# Patient Record
Sex: Male | Born: 1950 | Race: White | Hispanic: No | Marital: Married | Smoking: Former smoker
Health system: Southern US, Community
[De-identification: ages and names within clinical notes are randomized; demographics above are authoritative.]

## PROBLEM LIST (undated history)

## (undated) DIAGNOSIS — E785 Hyperlipidemia, unspecified: Secondary | ICD-10-CM

## (undated) DIAGNOSIS — L570 Actinic keratosis: Secondary | ICD-10-CM

## (undated) DIAGNOSIS — I10 Essential (primary) hypertension: Secondary | ICD-10-CM

## (undated) DIAGNOSIS — Z720 Tobacco use: Secondary | ICD-10-CM

## (undated) HISTORY — DX: Tobacco use: Z72.0

## (undated) HISTORY — DX: Hyperlipidemia, unspecified: E78.5

## (undated) HISTORY — DX: Essential (primary) hypertension: I10

## (undated) HISTORY — DX: Actinic keratosis: L57.0

---

## 1994-04-08 HISTORY — PX: OTHER SURGICAL HISTORY: SHX169

## 2003-08-09 DIAGNOSIS — C4491 Basal cell carcinoma of skin, unspecified: Secondary | ICD-10-CM

## 2003-08-09 HISTORY — DX: Basal cell carcinoma of skin, unspecified: C44.91

## 2003-12-07 ENCOUNTER — Other Ambulatory Visit: Payer: Self-pay

## 2005-07-05 ENCOUNTER — Ambulatory Visit: Payer: Self-pay | Admitting: Physician Assistant

## 2005-11-08 ENCOUNTER — Ambulatory Visit: Payer: Self-pay | Admitting: Gastroenterology

## 2006-08-08 ENCOUNTER — Emergency Department: Payer: Self-pay

## 2015-01-27 ENCOUNTER — Other Ambulatory Visit: Payer: Self-pay | Admitting: Family Medicine

## 2015-01-27 ENCOUNTER — Encounter: Payer: Self-pay | Admitting: Family Medicine

## 2015-01-27 DIAGNOSIS — Z720 Tobacco use: Secondary | ICD-10-CM

## 2015-01-27 HISTORY — DX: Tobacco use: Z72.0

## 2015-01-28 ENCOUNTER — Inpatient Hospital Stay: Payer: BLUE CROSS/BLUE SHIELD | Attending: Family Medicine | Admitting: Family Medicine

## 2015-01-28 ENCOUNTER — Other Ambulatory Visit: Payer: Self-pay | Admitting: Family Medicine

## 2015-01-28 ENCOUNTER — Ambulatory Visit
Admission: RE | Admit: 2015-01-28 | Discharge: 2015-01-28 | Disposition: A | Discharge status: Home / Self Care | Payer: Medicare Other | Source: Ambulatory Visit | Attending: Family Medicine | Admitting: Family Medicine

## 2015-01-28 DIAGNOSIS — J439 Emphysema, unspecified: Secondary | ICD-10-CM | POA: Insufficient documentation

## 2015-01-28 DIAGNOSIS — I700 Atherosclerosis of aorta: Secondary | ICD-10-CM | POA: Diagnosis not present

## 2015-01-28 DIAGNOSIS — Z720 Tobacco use: Secondary | ICD-10-CM | POA: Insufficient documentation

## 2015-01-28 DIAGNOSIS — Z87891 Personal history of nicotine dependence: Secondary | ICD-10-CM

## 2015-01-28 DIAGNOSIS — Z122 Encounter for screening for malignant neoplasm of respiratory organs: Secondary | ICD-10-CM | POA: Diagnosis not present

## 2015-01-28 NOTE — Progress Notes (Signed)
In accordance with CMS guidelines, patient has meet eligibility criteria including age, absence of signs or symptoms of lung cancer, the specific calculation of cigarette smoking pack-years was 30 years and he is a former smoker who quit approximately 11 years ago.  A shared decision-making session was conducted prior to the performance of CT scan. This includes one or more decision aids, includes benefits and harms of screening, follow-up diagnostic testing, over-diagnosis, false positive rate, and total radiation exposure.  Counseling on the importance of adherence to annual lung cancer LDCT screening, impact of co-morbidities, and ability or willingness to undergo diagnosis and treatment is imperative for compliance of the program.  Counseling on the importance of continued smoking cessation for former smokers; the importance of smoking cessation for current smokers and information about tobacco cessation interventions have been given to patient including the [place] [facility], 1800 quit [place], as well as [place] specific smoking cessation programs.  Written order for lung cancer screening with LDCT has been given to the patient and any and all questions have been answered to the best of my abilities.   Yearly follow up will be scheduled by Burgess Estelle, Thoracic Navigator.

## 2015-01-29 ENCOUNTER — Telehealth: Payer: Self-pay | Admitting: *Deleted

## 2015-01-29 NOTE — Telephone Encounter (Signed)
Oncology Nurse Navigator Documentation  Oncology Nurse Navigator Flowsheets 01/29/2015  Navigator Encounter Type Screening  Time Spent with Patient 15   Notified patient of LDCT lung cancer screening results of Lung Rads  2   finding with recommendation for 12 month follow up imaging. Also notified of incidental finding of mild emphysema and aortic atherosclerosis.

## 2016-02-10 ENCOUNTER — Telehealth: Payer: Self-pay | Admitting: *Deleted

## 2016-02-10 NOTE — Telephone Encounter (Signed)
Notified patient that annual lung cancer screening low dose CT scan is due. Confirmed that patient is within the age range of 55-77, and asymptomatic, (no signs or symptoms of lung cancer). Patient denies illness that would prevent curative treatment for lung cancer if found. The patient is a former, smoker, quit 2005, with a 30 pack year history. The shared decision making visit was done 01/28/15 Patient is agreeable for CT scan being scheduled.

## 2016-02-15 ENCOUNTER — Other Ambulatory Visit: Payer: Self-pay | Admitting: Family Medicine

## 2016-02-15 DIAGNOSIS — Z87891 Personal history of nicotine dependence: Secondary | ICD-10-CM | POA: Insufficient documentation

## 2016-03-23 ENCOUNTER — Ambulatory Visit
Admission: RE | Admit: 2016-03-23 | Discharge: 2016-03-23 | Disposition: A | Discharge status: Home / Self Care | Payer: Medicare Other | Source: Ambulatory Visit | Attending: Family Medicine | Admitting: Family Medicine

## 2016-03-23 DIAGNOSIS — Z122 Encounter for screening for malignant neoplasm of respiratory organs: Secondary | ICD-10-CM | POA: Insufficient documentation

## 2016-03-23 DIAGNOSIS — I700 Atherosclerosis of aorta: Secondary | ICD-10-CM | POA: Insufficient documentation

## 2016-03-23 DIAGNOSIS — Z87891 Personal history of nicotine dependence: Secondary | ICD-10-CM

## 2016-03-23 DIAGNOSIS — I2510 Atherosclerotic heart disease of native coronary artery without angina pectoris: Secondary | ICD-10-CM | POA: Diagnosis not present

## 2016-03-25 ENCOUNTER — Telehealth: Payer: Self-pay | Admitting: *Deleted

## 2016-03-25 NOTE — Telephone Encounter (Signed)
Notified patient of LDCT lung cancer screening results with recommendation for 12 month follow up imaging. Also notified of incidental finding noted below. Patient verbalizes understanding.   IMPRESSION: 1. Lung-RADS Category 2S, benign appearance or behavior. Continue annual screening with low-dose chest CT without contrast in 12 months. 2. The "S" modifier above refers to potentially clinically significant non lung cancer related findings. Specifically, three-vessel coronary atherosclerosis . 3. Aortic atherosclerosis

## 2017-03-13 ENCOUNTER — Telehealth: Payer: Self-pay | Admitting: *Deleted

## 2017-03-13 DIAGNOSIS — Z87891 Personal history of nicotine dependence: Secondary | ICD-10-CM

## 2017-03-13 DIAGNOSIS — Z122 Encounter for screening for malignant neoplasm of respiratory organs: Secondary | ICD-10-CM

## 2017-03-13 NOTE — Telephone Encounter (Signed)
Notified patient that annual lung cancer screening low dose CT scan is due currently or will be in near future. Confirmed that patient is within the age range of 55-77, and asymptomatic, (no signs or symptoms of lung cancer). Patient denies illness that would prevent curative treatment for lung cancer if found. Verified smoking history, (former, quit 2005, 30 pack year). The shared decision making visit was done 01/28/15. Patient is agreeable for CT scan being scheduled.

## 2017-03-21 ENCOUNTER — Ambulatory Visit: Payer: Medicare Other

## 2017-03-27 ENCOUNTER — Ambulatory Visit
Admission: RE | Admit: 2017-03-27 | Discharge: 2017-03-27 | Disposition: A | Discharge status: Home / Self Care | Payer: Medicare Other | Source: Ambulatory Visit | Attending: Oncology | Admitting: Oncology

## 2017-03-27 DIAGNOSIS — J432 Centrilobular emphysema: Secondary | ICD-10-CM | POA: Diagnosis not present

## 2017-03-27 DIAGNOSIS — Z122 Encounter for screening for malignant neoplasm of respiratory organs: Secondary | ICD-10-CM | POA: Diagnosis present

## 2017-03-27 DIAGNOSIS — I700 Atherosclerosis of aorta: Secondary | ICD-10-CM | POA: Diagnosis not present

## 2017-03-27 DIAGNOSIS — Z87891 Personal history of nicotine dependence: Secondary | ICD-10-CM | POA: Diagnosis not present

## 2017-04-03 ENCOUNTER — Encounter: Payer: Self-pay | Admitting: *Deleted

## 2018-03-12 ENCOUNTER — Telehealth: Payer: Self-pay | Admitting: *Deleted

## 2018-03-12 NOTE — Telephone Encounter (Signed)
Patient called r/t CT screening of lung due at this time.  Patient voiced understanding and agreement with CT screening.  Appointment sent to scheduling for future appointment.    

## 2018-03-13 ENCOUNTER — Telehealth: Payer: Self-pay | Admitting: *Deleted

## 2018-03-13 DIAGNOSIS — Z122 Encounter for screening for malignant neoplasm of respiratory organs: Secondary | ICD-10-CM

## 2018-03-13 DIAGNOSIS — Z87891 Personal history of nicotine dependence: Secondary | ICD-10-CM

## 2018-03-13 NOTE — Telephone Encounter (Signed)
Patient has been notified that annual lung cancer screening low dose CT scan is due currently or will be in near future. Confirmed that patient is within the age range of 55-77, and asymptomatic, (no signs or symptoms of lung cancer). Patient denies illness that would prevent curative treatment for lung cancer if found. Verified smoking history, (former, quit 2005, 30 pack year). The shared decision making visit was done 01/28/15. Patient is agreeable for CT scan being scheduled.

## 2018-03-27 ENCOUNTER — Ambulatory Visit
Admission: RE | Admit: 2018-03-27 | Discharge: 2018-03-27 | Disposition: A | Discharge status: Home / Self Care | Payer: Medicare Other | Source: Ambulatory Visit | Attending: Nurse Practitioner | Admitting: Nurse Practitioner

## 2018-03-27 ENCOUNTER — Encounter: Payer: Self-pay | Admitting: *Deleted

## 2018-03-27 DIAGNOSIS — R918 Other nonspecific abnormal finding of lung field: Secondary | ICD-10-CM | POA: Insufficient documentation

## 2018-03-27 DIAGNOSIS — Z122 Encounter for screening for malignant neoplasm of respiratory organs: Secondary | ICD-10-CM | POA: Insufficient documentation

## 2018-03-27 DIAGNOSIS — I700 Atherosclerosis of aorta: Secondary | ICD-10-CM | POA: Insufficient documentation

## 2018-03-27 DIAGNOSIS — I2510 Atherosclerotic heart disease of native coronary artery without angina pectoris: Secondary | ICD-10-CM | POA: Insufficient documentation

## 2018-03-27 DIAGNOSIS — J439 Emphysema, unspecified: Secondary | ICD-10-CM | POA: Diagnosis not present

## 2018-03-27 DIAGNOSIS — Z87891 Personal history of nicotine dependence: Secondary | ICD-10-CM | POA: Diagnosis not present

## 2019-10-22 ENCOUNTER — Other Ambulatory Visit: Payer: Self-pay

## 2019-10-22 ENCOUNTER — Encounter: Payer: Self-pay | Admitting: Dermatology

## 2019-10-22 ENCOUNTER — Ambulatory Visit (INDEPENDENT_AMBULATORY_CARE_PROVIDER_SITE_OTHER): Payer: Medicare Other | Admitting: Dermatology

## 2019-10-22 DIAGNOSIS — L570 Actinic keratosis: Secondary | ICD-10-CM

## 2019-10-22 DIAGNOSIS — Z1283 Encounter for screening for malignant neoplasm of skin: Secondary | ICD-10-CM | POA: Diagnosis not present

## 2019-10-22 DIAGNOSIS — L578 Other skin changes due to chronic exposure to nonionizing radiation: Secondary | ICD-10-CM

## 2019-10-22 DIAGNOSIS — L814 Other melanin hyperpigmentation: Secondary | ICD-10-CM

## 2019-10-22 DIAGNOSIS — L820 Inflamed seborrheic keratosis: Secondary | ICD-10-CM | POA: Diagnosis not present

## 2019-10-22 DIAGNOSIS — Z85828 Personal history of other malignant neoplasm of skin: Secondary | ICD-10-CM

## 2019-10-22 DIAGNOSIS — D229 Melanocytic nevi, unspecified: Secondary | ICD-10-CM

## 2019-10-22 DIAGNOSIS — L821 Other seborrheic keratosis: Secondary | ICD-10-CM

## 2019-10-22 DIAGNOSIS — D1801 Hemangioma of skin and subcutaneous tissue: Secondary | ICD-10-CM

## 2019-10-22 NOTE — Patient Instructions (Signed)
Cryotherapy Aftercare  . Wash gently with soap and water everyday.   . Apply Vaseline and Band-Aid daily until healed.  

## 2019-10-22 NOTE — Progress Notes (Signed)
   Follow-Up Visit   Subjective  Jeremy Gilbert is a 69 y.o. male who presents for the following: Annual Exam (UBSE. Sore on left shoulder to be checked. Hx BCC of the left alar crease.).  The following portions of the chart were reviewed this encounter and updated as appropriate:     Review of Systems: No other skin or systemic complaints.  Objective  Well appearing patient in no apparent distress; mood and affect are within normal limits.  All skin waist up examined.  Objective  Right Dorsal Hand x 1, right forearm x 1, nasal dorsum x 1 (3): Erythematous thin papules/macules with gritty scale.   Objective  Face, arms: Diffuse scaly erythematous macules with underlying dyspigmentation.   Objective  Trunk: Red papules.   Objective  Left Alar Crease: Well healed scar with no evidence of recurrence.   Objective  Left medial shoulder x 1, chest x 4, left flank x 1 (6): Erythematous keratotic or waxy stuck-on papule or plaque. Irritated  Objective  Arms: Scattered tan macules.   Objective  Left Nasal Rim: 4.72mm flesh yellow papule Nevus vs Sebaceous Hyperplasia   Objective  Trunk: Stuck-on, waxy, tan-brown papules and plaques -- Discussed benign etiology and prognosis.   Assessment & Plan  AK (actinic keratosis) (3) Right Dorsal Hand x 1, right forearm x 1, nasal dorsum x 1  Destruction of lesion - Right Dorsal Hand x 1, right forearm x 1, nasal dorsum x 1  Destruction method: cryotherapy   Informed consent: discussed and consent obtained   Lesion destroyed using liquid nitrogen: Yes   Outcome: patient tolerated procedure well with no complications   Post-procedure details: wound care instructions given    Actinic skin damage Face, arms  Recommend daily use of broad spectrum spf 30+ sunscreen   Hemangioma of skin Trunk  Benign, observe.    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 No evidence of recurrence.   Inflamed  seborrheic keratosis (6) Left medial shoulder x 1, chest x 4, left flank x 1  Destruction of lesion - Left medial shoulder x 1, chest x 4, left flank x 1  Destruction method: cryotherapy   Informed consent: discussed and consent obtained   Lesion destroyed using liquid nitrogen: Yes   Outcome: patient tolerated procedure well with no complications   Post-procedure details: wound care instructions given    Lentigines Arms  Benign, observe.    Nevus Left Nasal Rim  Benign-appearing.  Observation   Seborrheic keratosis Trunk  Benign, observe.

## 2019-11-01 IMAGING — CT CT CHEST LUNG CANCER SCREENING LOW DOSE W/O CM
2 of 5 series · 15 of 40 positions shown, 18 images · non-contrast
Comparison: 03/27/2017

CLINICAL DATA: Lung cancer screening. Thirty pack-year history.
Current asymptomatic former smoker.

EXAM:
CT CHEST WITHOUT CONTRAST LOW-DOSE FOR LUNG CANCER SCREENING
TECHNIQUE: Multidetector CT imaging of the chest was performed following the
standard protocol without IV contrast.

[Series 3: lung · axial · 0.65mm/px · z∈[-1150,-856]mm · 12 of 324 slices shown, 15 images (1 of 2)]
[im 15/324  mediastinal]
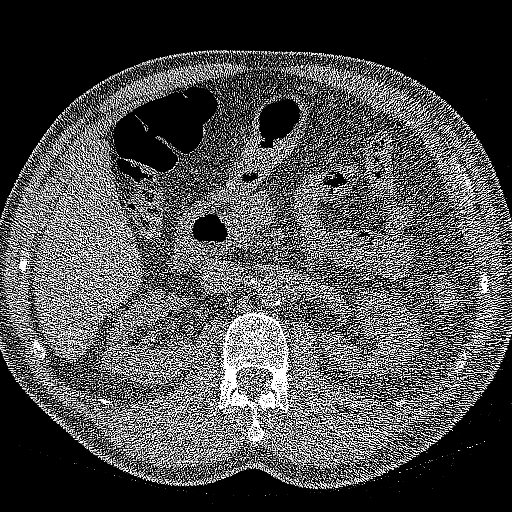
[im 15/324  lung]
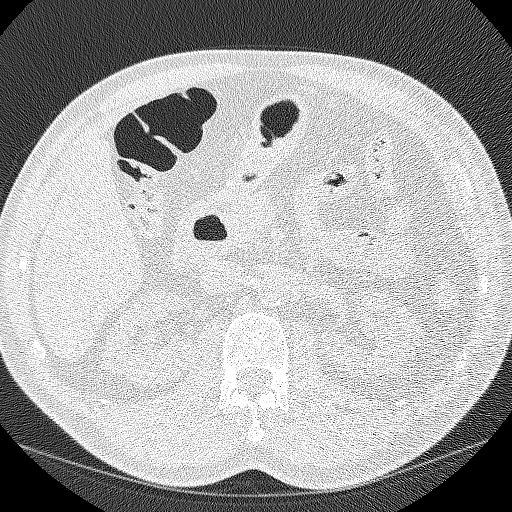
[im 45/324  lung]
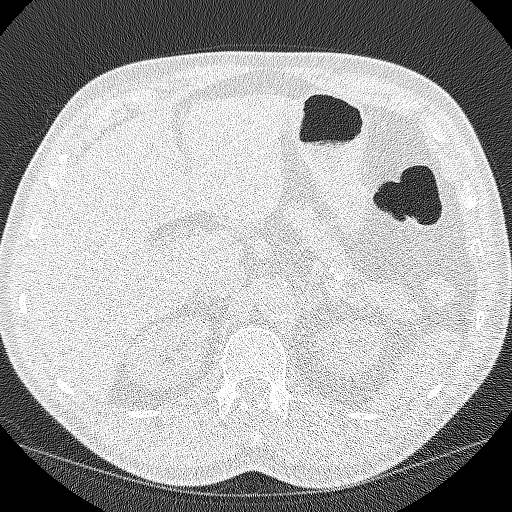
[im 74/324  lung]
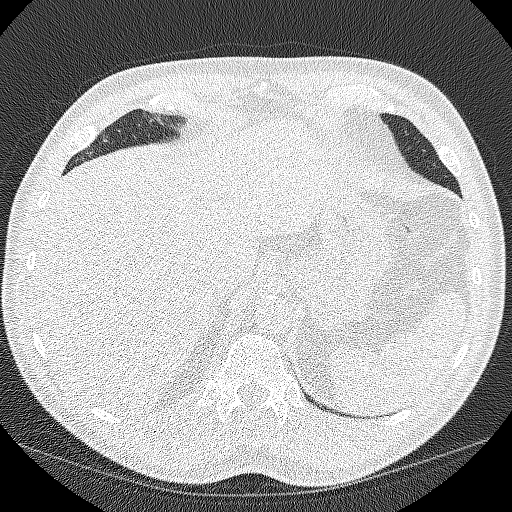
[im 103/324  lung]
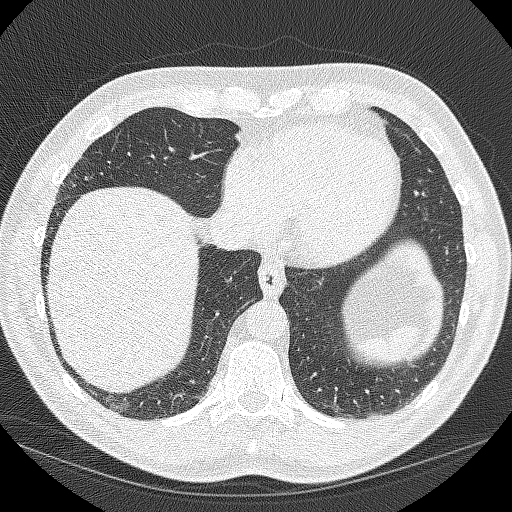
[im 118/324  mediastinal]
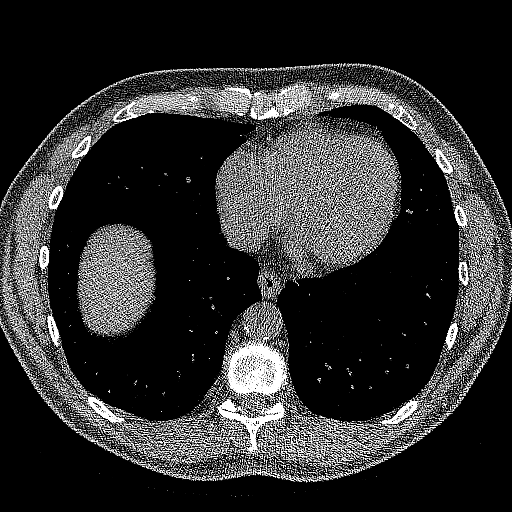
[im 118/324  lung]
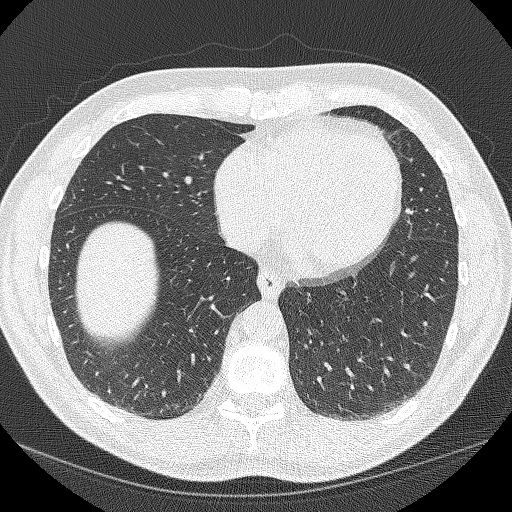
[im 147/324  lung]
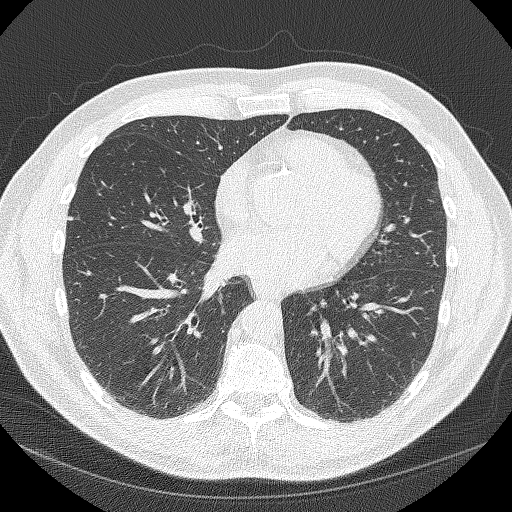
[im 177/324  lung]
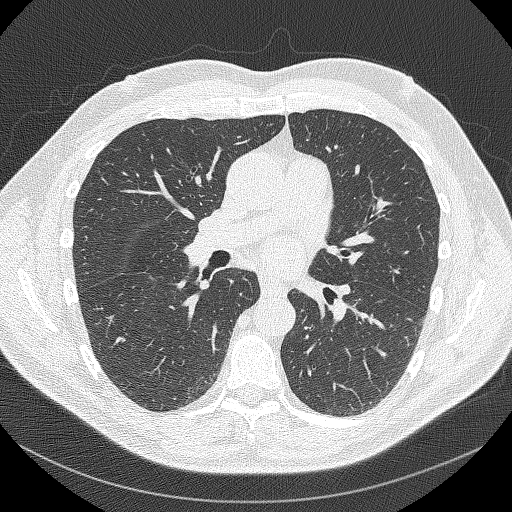
[im 206/324  lung]
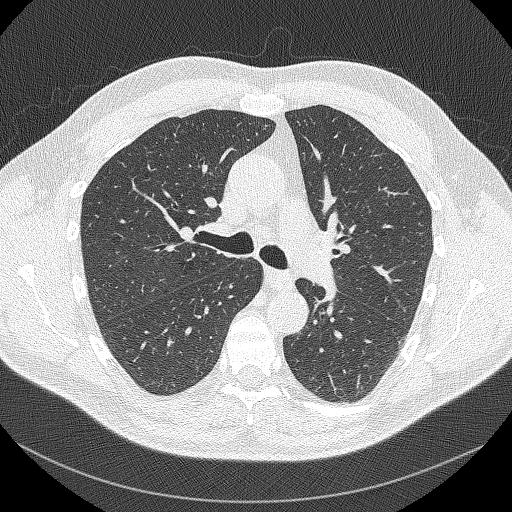
[im 221/324  mediastinal]
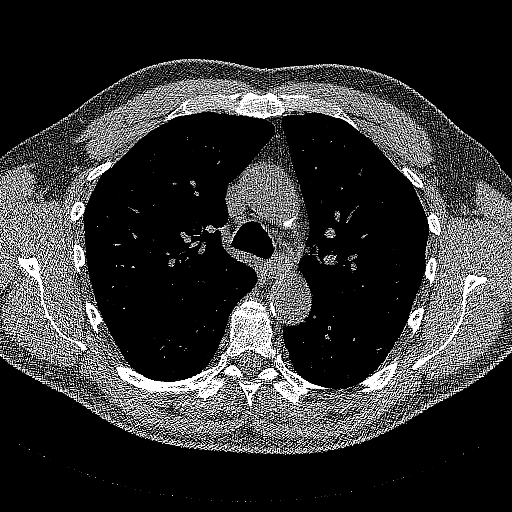
[im 221/324  lung]
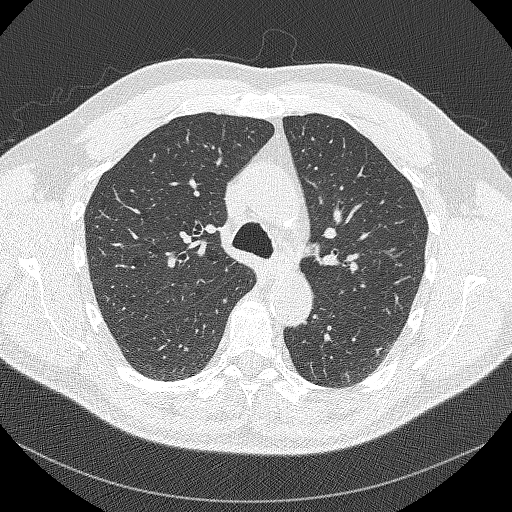
[im 250/324  lung]
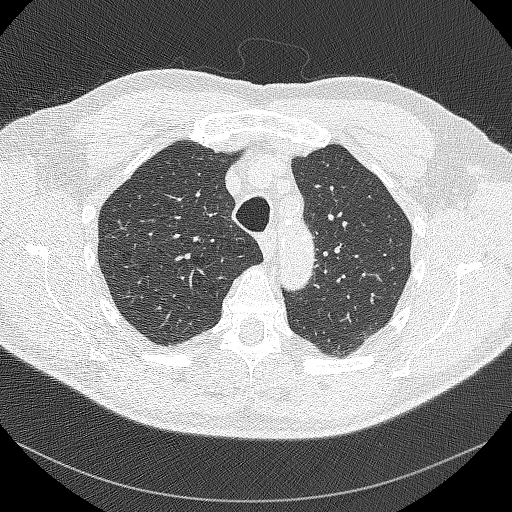
[im 279/324  lung]
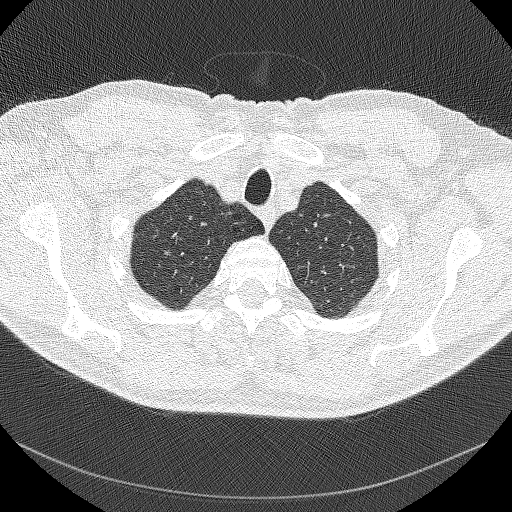
[im 309/324  lung]
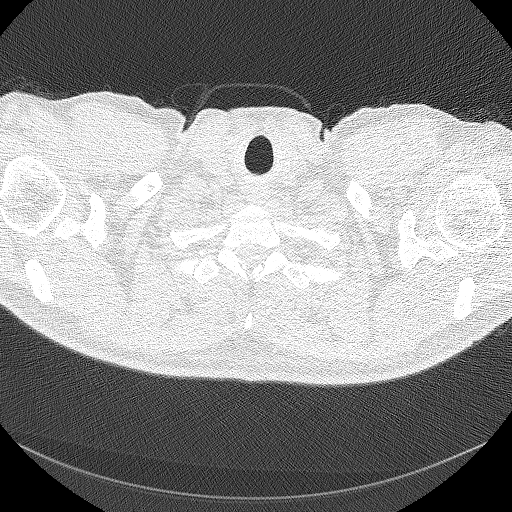

[Series 4: lung · coronal · 0.63mm/px · 3 of 278 slices shown (2 of 2)]
[im 56/278  lung]
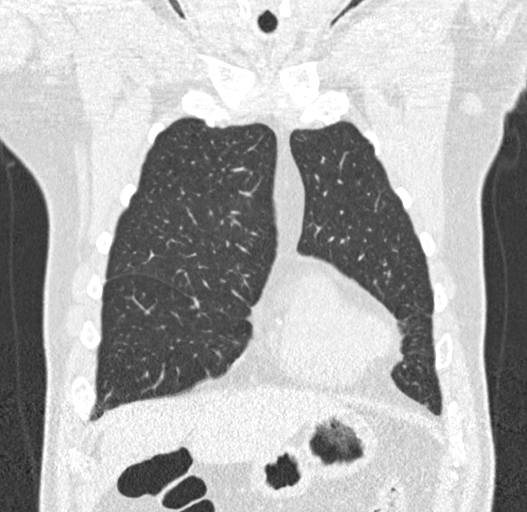
[im 111/278  lung]
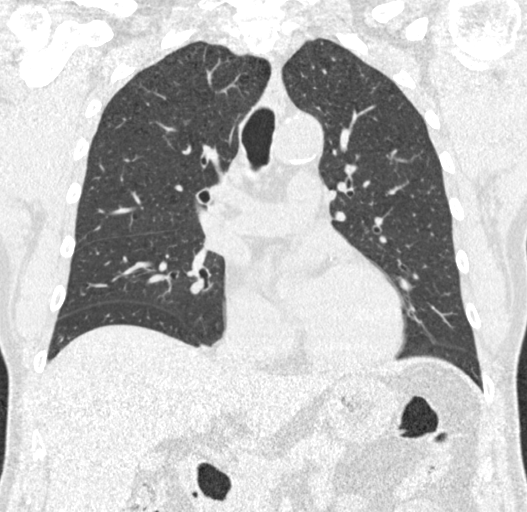
[im 167/278  lung]
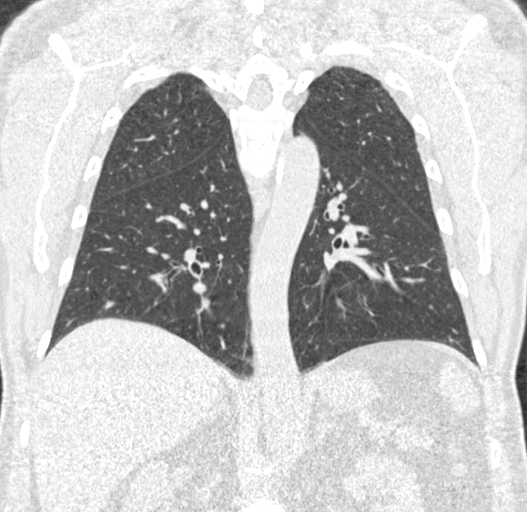

[15 of 40 positions shown; findings below may reference images not displayed]

FINDINGS: Cardiovascular: The heart size appears within normal limits. There
is no pericardial effusion identified. Aortic atherosclerosis.
Calcification in the LAD and left circumflex and RCA coronary
arteries noted.

Mediastinum/Nodes: Normal appearance of the thyroid gland. The
trachea appears patent and is midline. Normal appearance of the
esophagus. No enlarged axillary, mediastinal or hilar adenopathy.

Lungs/Pleura: Moderate changes of emphysema. No pleural effusion
identified. No airspace consolidation, pneumothorax or atelectasis.
Previously noted small solid pulmonary nodules are again noted and
do not appear significantly changed in the interval. The largest is
in the medial right upper lobe with an equivalent diameter 4.1 mm.
No new suspicious pulmonary nodules.

Upper Abdomen: No acute abnormality.

Musculoskeletal: No chest wall mass or suspicious bone lesions
identified.
IMPRESSION: 1. Lung-RADS 2, benign appearance or behavior. Continue annual
screening with low-dose chest CT without contrast in 12 months.
2. Aortic Atherosclerosis (WJBVY-JTV.V) and Emphysema (WJBVY-UWZ.3).
3. Multi vessel coronary artery atherosclerotic calcifications.

## 2020-04-09 ENCOUNTER — Other Ambulatory Visit: Payer: Self-pay | Admitting: Oncology

## 2020-04-09 DIAGNOSIS — R918 Other nonspecific abnormal finding of lung field: Secondary | ICD-10-CM

## 2020-04-09 NOTE — Progress Notes (Signed)
  Pulmonary Nodule Clinic Telephone Note [place]   Received referral from [facility] [place], low-dose CT screening program.  HPI: Jeremy Gilbert is a 69 year old male with past medical history significant for tobacco abuse who was previously part of the low-dose CT screening program.  Patient quit smoking in 2005 so no longer meets criteria.  Last CT chest from 03/27/2018 shows several small solid pulmonary nodules that remained unchanged but one that measured 4.1 mm in size.  No suspicious pulmonary nodules.  Recommended follow-up in 12 months.  Review and Recommendations: I personally reviewed all patient's previous imaging including most recent low-dose CT scan from August 2019.  I recommend follow-up with noncontrast CT of his chest in the next 1 to 2 weeks.  Social History: Patient is a former smoker.    Tobacco Use:   . Smoking Tobacco Use: Not on file  . Smokeless Tobacco Use: Not on file     High risk factors include: History of heavy smoking, exposure to asbestos, radium or uranium, personal family history of lung cancer, older age, sex (females greater than males), race (black and native [place] greater than weight), marginal speculation, upper lobe location, multiplicity (less than 5 nodules increases risk for malignancy) and emphysema and/or pulmonary fibrosis.   This recommendation follows the consensus statement: Guidelines for Management of Incidental Pulmonary Nodules Detected on CT Images: From the Fleischner Society 2017; Radiology 2017; 284:228-243.    I have placed order for CT scan without contrast to be completed in approximately 1 to 2 weeks..  Disposition: Order placed for repeat CT chest. Will notify [place] in scheduling. [place] to call patient with appointment date and time. Return to pulmonary nodule clinic a few days after his repeat imaging to discuss results and plan moving forward.  Faythe Casa, NP 04/09/2020 10:10  AM

## 2020-04-16 ENCOUNTER — Telehealth: Payer: Self-pay | Admitting: *Deleted

## 2020-04-16 NOTE — Telephone Encounter (Signed)
Left message with patient to review referral to the lung nodule program and review upcoming appts. Pt instructed to call back to further discuss.

## 2020-04-17 ENCOUNTER — Ambulatory Visit: Payer: Medicare Other

## 2020-04-20 ENCOUNTER — Ambulatory Visit: Payer: Medicare Other

## 2020-04-20 NOTE — Telephone Encounter (Signed)
Spoke with patient and reviewed upcoming appts scheduled in the lung nodule clinic. appts mailed per pt request. Instructed to call back with any further questions or needs. Pt verbalized understanding.

## 2020-04-23 ENCOUNTER — Ambulatory Visit: Payer: Medicare Other | Admitting: Oncology

## 2020-04-27 ENCOUNTER — Other Ambulatory Visit: Payer: Self-pay

## 2020-04-27 ENCOUNTER — Ambulatory Visit
Admission: RE | Admit: 2020-04-27 | Discharge: 2020-04-27 | Disposition: A | Discharge status: Home / Self Care | Payer: Medicare Other | Source: Ambulatory Visit | Attending: Oncology | Admitting: Oncology

## 2020-04-27 DIAGNOSIS — R918 Other nonspecific abnormal finding of lung field: Secondary | ICD-10-CM | POA: Diagnosis not present

## 2020-04-30 ENCOUNTER — Inpatient Hospital Stay: Payer: Medicare Other | Attending: Oncology | Admitting: Oncology

## 2020-04-30 DIAGNOSIS — R918 Other nonspecific abnormal finding of lung field: Secondary | ICD-10-CM

## 2020-04-30 NOTE — Progress Notes (Signed)
Pulmonary Nodule Clinic Consult note [facility]  [phone] [phone]  Patient Care Team: Juluis Pitch, MD as PCP - General (Family Medicine)   Name of the patient: Jeremy Gilbert  [id number]  1950/08/09   Date of visit: 04/30/2020   Diagnosis- Lung Nodule  Virtual Visit via Telephone Note   I connected with Jeremy Gilbert on 04/30/20 at 10:00am by telephone visit and verified that I am speaking with the correct person using two identifiers.   I discussed the limitations, risks, security and privacy concerns of performing an evaluation and management service by telemedicine and the availability of in-person appointments. I also discussed with the patient that there may be a patient responsible charge related to this service. The patient expressed understanding and agreed to proceed.   Other persons participating in the visit and their role in the encounter:   Patient's location: Home  Provider's location: Clinic  Chief complaint/ Reason for visit- Pulmonary Nodule Clinic Initial Visit  Past Medical History:  Jeremy Gilbert is a 69 year old male with past medical history significant for tobacco abuse who was previously part of the low-dose CT screening program.  Patient quit smoking in 2005 so no longer meets criteria.  Last CT chest from 03/27/2018 shows several small solid pulmonary nodules that remained unchanged but one that measured 4.1 mm in size.  No suspicious pulmonary nodules.  Recommended follow-up in 12 months.  Interval history-  Jeremy Gilbert presents today to review his most recent CT chest without contrast.  Jeremy Gilbert is retired. 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 He has been retired for about 5 years now. 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  He is married and lives at home with his wife in [place].  He is vaccinated against COVID-19. Patient did have Covid back in December 2020 about a week after his first vaccine. He was not hospitalized and recovered well.  He quit smoking back in 2005. He smoked less than a pack of cigarettes per day for about 25 years.    Tobacco Use:   . Smoking Tobacco Use: Not on file  . Smokeless Tobacco Use: Not on file    Has family history of cancer in both of his grandfathers but he is uncertain of the type. Has personal history of actinic keratosis (10/22/2019) from right dorsal hand, right forearm and nasal dorsum that was removed via cryotherapy by Dr. Nicole Kindred in dermatology.  Currently, he has a "head cold". States yesterday he developed sinus congestion with slight nonproductive cough and fever. He denies any unintentional weight loss, chest pain, nausea, vomiting, constipation or diarrhea.  ECOG FS:0 - Asymptomatic  Review of systems- Review of Systems  Constitutional: Positive for fever and malaise/fatigue. Negative for chills and weight loss.  HENT: Positive for congestion and sinus pain. Negative for ear pain and tinnitus.   Eyes: Negative.  Negative for blurred vision and double vision.  Respiratory: Negative.  Negative for cough, sputum production and shortness of breath.   Cardiovascular: Negative.  Negative for chest pain, palpitations and leg swelling.  Gastrointestinal: Negative.  Negative for abdominal pain, constipation, diarrhea, nausea and vomiting.  Genitourinary: Negative for dysuria, frequency and urgency.  Musculoskeletal: Positive for myalgias. Negative for back pain and falls.  Skin: Negative.  Negative for rash.  Neurological: Negative.  Negative for weakness and headaches.  Endo/Heme/Allergies: Negative.  Does  not bruise/bleed easily.  Psychiatric/Behavioral: Negative.  Negative for depression. The patient is not nervous/anxious and does not have insomnia.      Allergies  Allergen Reactions  . Ace Inhibitors  Cough  . Hydrocodone-Acetaminophen Itching  . Oxycodone-Acetaminophen Other (See Comments)    shaking     Past Medical History:  Diagnosis Date  . Actinic keratosis   . Basal cell carcinoma 2005   L alar crease  . Tobacco abuse 01/27/2015     No past surgical history on file.  Social History   Socioeconomic History  . Marital status: Married    Spouse name: Not on file  . Number of children: Not on file  . Years of education: Not on file  . Highest education level: Not on file  Occupational History  . Not on file  Tobacco Use  . Smoking status: Not on file  Substance and Sexual Activity  . Alcohol use: Not on file  . Drug use: Not on file  . Sexual activity: Not on file  Other Topics Concern  . Not on file  Social History Narrative  . Not on file   Social Determinants of Health   Financial Resource Strain:   . Difficulty of Paying Living Expenses: Not on file  Food Insecurity:   . Worried About Charity fundraiser in the Last Year: Not on file  . Ran Out of Food in the Last Year: Not on file  Transportation Needs:   . Lack of Transportation (Medical): Not on file  . Lack of Transportation (Non-Medical): Not on file  Physical Activity:   . Days of Exercise per Week: Not on file  . Minutes of Exercise per Session: Not on file  Stress:   . Feeling of Stress : Not on file  Social Connections:   . Frequency of Communication with Friends and Family: Not on file  . Frequency of Social Gatherings with Friends and Family: Not on file  . Attends Religious Services: Not on file  . Active Member of Clubs or Organizations: Not on file  . Attends Archivist Meetings: Not on file  . Marital Status: Not on file  Intimate Partner Violence:   . Fear of Current or Ex-Partner: Not on file  . Emotionally Abused: Not on file  . Physically Abused: Not on file  . Sexually Abused: Not on file    No family history on file.   Current Outpatient Medications:  .   losartan (COZAAR) 100 MG tablet, Take 100 mg by mouth once., Disp: , Rfl:  .  metoprolol succinate (TOPROL-XL) 100 MG 24 hr tablet, Take 200 mg by mouth once., Disp: , Rfl:   Physical exam: There were no vitals filed for this visit. Limited secondary to telephone visit.  No flowsheet data found. No flowsheet data found.  No images are attached to the encounter.  CT Chest Wo Contrast  Result Date: 04/27/2020 CLINICAL DATA:  Followup pulmonary nodules. EXAM: CT CHEST WITHOUT CONTRAST TECHNIQUE: Multidetector CT imaging of the chest was performed following the standard protocol without IV contrast. COMPARISON:  Multiple prior lung cancer screening CT scans. The most recent is 03/27/2018 FINDINGS: Cardiovascular: The heart is normal in size. No pericardial effusion. Stable mild tortuosity and moderate atherosclerotic calcification involving the thoracic aorta. No focal aneurysm. Mediastinum/Nodes: Small scattered mediastinal and hilar lymph nodes are stable. No mass or overt adenopathy. The esophagus is unremarkable. Lungs/Pleura: Stable underlying emphysematous changes. No acute overlying pulmonary process. A few  tiny scattered subpleural nodules are stable. The 4 mm nodule seen in the right upper lobe medially on the prior CT scan now measures 2.5 mm. No new pulmonary nodules or worrisome pulmonary lesions. Upper Abdomen: No significant upper abdominal findings. Stable scattered atherosclerotic calcifications. No worrisome hepatic or adrenal gland lesions. Musculoskeletal: No chest wall mass, supraclavicular or axillary adenopathy. Slight increase and symmetric bilateral gynecomastia. IMPRESSION: 1. Stable emphysematous changes and pulmonary scarring. No acute overlying pulmonary process. 2. Stable tiny scattered subpleural nodules. No new pulmonary nodules or worrisome pulmonary lesions. No interval change since 2017 consistent with benign disease. 3. No mediastinal or hilar mass or adenopathy. 4. Stable  atherosclerotic calcifications involving the thoracic aorta and branch vessels. 5. Emphysema and aortic atherosclerosis. Aortic Atherosclerosis (ICD10-I70.0) and Emphysema (ICD10-J43.9). Electronically Signed   By: Marijo Sanes M.D.   On: 04/27/2020 08:42     Assessment and plan- Patient is a 69 y.o. male who presents to pulmonary nodule clinic for follow-up of incidental lung nodules.  A telephone visit was conducted to review most recent CT scan results.    CT chest without contrast from 04/27/2020 shows stable emphysema and pulmonary scarring. No acute overlying pulmonary process. Stable tiny scattered subpleural nodules. No new pulmonary nodules or worrisome pulmonary lesions. No interval change since 2017 consistent with benign disease. No mediastinal or hilar mass or adenopathy. Stable arthrosclerotic calcifications involving thoracic aorta and branch vessels.   Calculating malignancy probability of a pulmonary nodule: Risk factors include: 1.  Age. 2.  Cancer history. 3.  Diameter of pulmonary nodule and mm 4.  Location 5.  Smoking history 6.  Spiculation present   Based on risk factors, this patient is low risk for the development of lung cancer. Given pulmonary nodules have been stable since 2007 which is greater than 4 years, no additional follow-up needed in our clinic. He is not qualify for low-dose CT screening program given his quit date is greater than 15 years.  During our visit, we discussed pulmonary nodules are a common incidental finding and are often how lung cancer is discovered.  Lung cancer survival is directly related to the stage at diagnosis.  We discussed that nodules can vary in presentation from solitary pulmonary nodules to masses, 2 groundglass opacities and multiple nodules.  Pulmonary nodules in the majority of cases are benign but the probability of these becoming malignant cannot be undermined.  Early identification of malignant nodules could lead to early  diagnosis and increased survival.   We discussed the probability of pulmonary nodules becoming malignant increase with age, pack years of tobacco use, size/characteristics of the nodule and location; with upper lobe involvement being most worrisome.   We discussed the goal of our clinic is to thoroughly evaluate each nodule, developed a comprehensive, individualized plan of care utilizing the most advanced technology and significantly reduce the time from detection to treatment.  A dedicated pulmonary nodule clinic has proven to indeed expedite the detection and treatment of lung cancer.   Patient education in fact sheet provided along with most recent CT scans.  Plan- Review CT scan. Reviewed past medical history. Reviewed occupational history. Covid testing if symptoms worsen over the next 24 hours given fever and sinus congestion. Patient in agreement.  Disposition- No additional follow-up needed in the pulmonary nodule clinic. We will touch base with PCP with recommendations.   Visit Diagnosis 1. Multiple lung nodules     Patient expressed understanding and was in agreement with this plan. He also  understands that He can call clinic at any time with any questions, concerns, or complaints.   Greater than 50% was spent in counseling and coordination of care with this patient including but not limited to discussion of the relevant topics above (See A&P) including, but not limited to diagnosis and management of acute and chronic medical conditions.   Thank you for allowing me to participate in the care of this very pleasant patient.    Jacquelin Hawking, NP [facility] Cell - [phone] Pager- [phone] 04/30/2020 10:25 AM

## 2020-08-11 ENCOUNTER — Other Ambulatory Visit: Payer: Self-pay

## 2020-08-11 ENCOUNTER — Ambulatory Visit (INDEPENDENT_AMBULATORY_CARE_PROVIDER_SITE_OTHER): Payer: Medicare Other | Admitting: Dermatology

## 2020-08-11 DIAGNOSIS — L821 Other seborrheic keratosis: Secondary | ICD-10-CM | POA: Diagnosis not present

## 2020-08-11 DIAGNOSIS — L309 Dermatitis, unspecified: Secondary | ICD-10-CM

## 2020-08-11 DIAGNOSIS — L820 Inflamed seborrheic keratosis: Secondary | ICD-10-CM | POA: Diagnosis not present

## 2020-08-11 MED ORDER — CLOBETASOL PROPIONATE 0.05 % EX CREA: TOPICAL_CREAM | CUTANEOUS | 0 refills | Status: DC

## 2020-08-11 NOTE — Progress Notes (Signed)
   Follow-Up Visit   Subjective  Jeremy Gilbert is a 70 y.o. male who presents for the following: Rash (Rash started around 3-4 weeks ago. Started in scalp and has spread to legs, feet, left arm, chest. Spots have come up and stayed. He is using OTC cortisone. ). No new products, no new medicines, no recent illness. No similar rash in past.   The following portions of the chart were reviewed this encounter and updated as appropriate:       Review of Systems:  No other skin or systemic complaints except as noted in HPI or Assessment and Plan.  Objective  Well appearing patient in no apparent distress; mood and affect are within normal limits.  A focused examination was performed including face, scalp, trunk, extremities. Relevant physical exam findings are noted in the Assessment and Plan.  Objective  L occipital, chest: Erythematous keratotic or waxy stuck-on papule  Objective  Upper chest, L medial knee, R lower cheek, L medial ankle: Pink edematous papules, confluent on chest.   Assessment & Plan  Inflamed seborrheic keratosis L occipital, chest  Start Halog solution qd/bid, sample given Discussed LN2 treatment if not improving.  Dermatitis Upper chest, L medial knee, R lower cheek, L medial ankle  Unclear etiology  Start Clobetasol cream Apply to AAs rash qd/bid until clear. Avoid face, groin, axilla.   Start Cloderm Cream Apply to rash on face BID until improved. Samples given.  Start Halog Solution Apply to AA scalp BID until improved. Sample given.  Discussed biopsy if not improved with topicals.  Pt will call back if not improved after two weeks  Topical steroids (such as triamcinolone, fluocinolone, fluocinonide, mometasone, clobetasol, halobetasol, betamethasone, hydrocortisone) can cause thinning and lightening of the skin if they are used for too long in the same area. Your physician has selected the right strength medicine for your problem and area  affected on the body. Please use your medication only as directed by your physician to prevent side effects.    clobetasol cream (TEMOVATE) 0.05 % - Upper chest, L medial knee, R lower cheek, L medial ankle   Seborrheic Keratoses - Stuck-on, waxy, tan-brown papules and plaques  - Discussed benign etiology and prognosis. - Observe - Call for any changes  Return as scheduled, for sooner if rash not improved.  [name], CMA, am acting as scribe for Willeen Niece, MD .  Documentation: I have reviewed the above documentation for accuracy and completeness, and I agree with the above.  Willeen Niece MD

## 2020-08-11 NOTE — Patient Instructions (Signed)
Cloderm Cream - Apply to rash on face twice a day until improved.  Halog solution - Apply to itchy spots in scalp 1-2 times a day until improved. Avoid face.  Clobetasol Cream - Apply to rash on body twice a day until improved. Use up to 4 weeks. Avoid face, groin, underarms.

## 2020-08-18 ENCOUNTER — Ambulatory Visit (INDEPENDENT_AMBULATORY_CARE_PROVIDER_SITE_OTHER): Payer: Medicare Other | Admitting: Dermatology

## 2020-08-18 ENCOUNTER — Other Ambulatory Visit: Payer: Self-pay

## 2020-08-18 DIAGNOSIS — L309 Dermatitis, unspecified: Secondary | ICD-10-CM

## 2020-08-18 MED ORDER — PREDNISONE 5 MG PO TABS: ORAL_TABLET | ORAL | 0 refills | Status: DC

## 2020-08-18 NOTE — Progress Notes (Signed)
   Follow-Up Visit   Subjective  Jeremy Gilbert is a 70 y.o. male who presents for the following: Follow-up (Rash on body has improved some. He has had new spots, but they are improving with clobetasol cream. He has more bumps in the scalp, treating with Halog solution. His face has gotten "puffy" in the last few days, improving some now. He wonders if this could be a side effect of using clobetasol on his body. No new products or medicines.).  He uses Suave shampoo and has for years.   The following portions of the chart were reviewed this encounter and updated as appropriate:       Review of Systems:  No other skin or systemic complaints except as noted in HPI or Assessment and Plan.  Objective  Well appearing patient in no apparent distress; mood and affect are within normal limits.  A focused examination was performed including face, neck, scalp, arms. chest. Relevant physical exam findings are noted in the Assessment and Plan.  Objective  face, scalp, neck: Pink edematous papules on bil cheeks, chin, post neck, scalp   Assessment & Plan  Dermatitis face, scalp, neck  Ulclear etiology- worsening  Start Prednisone 5mg  tablets x 3 week taper #150 0Rf. Take prednisone by mouth daily in morning with food starting at 60 mg dosage and gradually decreasing daily dose as directed until gone for a three week taper.  Patient was given written instructions.  Potential side effects reviewed.  Risks of prednisone taper discussed including mood irritability, insomnia, weight gain, stomach ulcers, increased risk of infection, increased blood sugar (diabetes), hypertension, osteoporosis with long-term or frequent use, and rare risk of avascular necrosis of the hip.   Samples of Free & Clear Shampoo with ZInc, VaniCream facial moisturizer  If rash recurs after finishing Prednisone, plan patch test and/or biopsy.   predniSONE (DELTASONE) 5 MG tablet - face, scalp, neck  Other Related  Medications clobetasol cream (TEMOVATE) 0.05 %  Return in about 1 month (around 09/18/2020) for rash.   Documentation: I have reviewed the above documentation for accuracy and completeness, and I agree with the above.  Brendolyn Patty MD

## 2020-08-18 NOTE — Patient Instructions (Addendum)
Take prednisone by mouth daily in morning with food starting at 60 mg dosage and gradually decreasing daily dose as directed until gone for a three week taper.  Patient was given written instructions.  Potential side effects reviewed.  Risks of prednisone taper discussed including mood irritability, insomnia, weight gain, stomach ulcers, increased risk of infection, increased blood sugar (diabetes), hypertension, osteoporosis with long-term or frequent use, and rare risk of avascular necrosis of the hip.

## 2020-09-16 ENCOUNTER — Ambulatory Visit (INDEPENDENT_AMBULATORY_CARE_PROVIDER_SITE_OTHER): Payer: Medicare Other | Admitting: Dermatology

## 2020-09-16 ENCOUNTER — Other Ambulatory Visit: Payer: Self-pay

## 2020-09-16 DIAGNOSIS — L309 Dermatitis, unspecified: Secondary | ICD-10-CM

## 2020-09-16 DIAGNOSIS — L820 Inflamed seborrheic keratosis: Secondary | ICD-10-CM

## 2020-09-16 DIAGNOSIS — L906 Striae atrophicae: Secondary | ICD-10-CM

## 2020-09-16 MED ORDER — FLUOCINONIDE 0.05 % EX SOLN: CUTANEOUS | 1 refills | Status: DC

## 2020-09-16 MED ORDER — TACROLIMUS 0.1 % EX OINT: TOPICAL_OINTMENT | Freq: Two times a day (BID) | CUTANEOUS | 1 refills | Status: DC

## 2020-09-16 NOTE — Progress Notes (Signed)
   Follow-Up Visit   Subjective  Jeremy Gilbert is a 70 y.o. male who presents for the following: Dermatitis (1 mo f/u. Pt finished a 3 wk prednisone 5 mg taper. He states that the rash was cleared up until approx last week when it came up again at face, scalp, upper chest, and left lower leg. Pt has also been using clobetasol cream. ).  He has noticed increased amounts of raised itchy bumps on chest and scalp.    The following portions of the chart were reviewed this encounter and updated as appropriate:      Review of Systems: No other skin or systemic complaints except as noted in HPI or Assessment and Plan.   Objective  Well appearing patient in no apparent distress; mood and affect are within normal limits.  A focused examination was performed including left leg, chest, scalp, face. Relevant physical exam findings are noted in the Assessment and Plan.  Objective  Left Lower Leg: Linear pink narrow patches   Objective  chest, face, and scalp: Erythema on chest, no scale Light pink scaly patches face and scalp   Objective  upper chest x 13, occipital scalp x 6 (19): Erythematous keratotic or waxy stuck-on papule- very itchy  st Assessment & Plan  Striae Left Lower Leg  Secondary to Clobetasol cream.   Pt has been applying to lower leg twice daily even after itchy rash resolved.  Discontinue clobetasol cream to lower leg.  Caution atrophy with long-term use.   Dermatitis chest, face, and scalp  Improved post Prednisone taper, but rash has recurred on face.  Pt still applying clobetasol cream. Discontinue clobetasol cream due to risk of atrophy.  Start Tacrolimus 0.1% ointment. Apply to itchy areas on face and body twice a day.   Start fluocinonide solution to scalp for itching. Apply 1-2 drops to scalp 1-2x/day. Continue Free and Clear shampoo  Recommend mild soap and moisturizing cream 1-2 times daily.  Continue Dove soap and Aveeno.   tacrolimus  (PROTOPIC) 0.1 % ointment - chest, face, and scalp  fluocinonide (LIDEX) 0.05 % external solution - chest, face, and scalp  Inflamed seborrheic keratosis (19) upper chest x 13, occipital scalp x 6  Prior to procedure, discussed risks of blister formation, small wound, skin dyspigmentation, or rare scar following cryotherapy.    Destruction of lesion - upper chest x 13, occipital scalp x 6  Destruction method: cryotherapy   Informed consent: discussed and consent obtained   Lesion destroyed using liquid nitrogen: Yes   Region frozen until ice ball extended beyond lesion: Yes   Outcome: patient tolerated procedure well with no complications   Post-procedure details: wound care instructions given    Return in 6 weeks (on 10/27/2020) for as scheduled.   I, Harriett Sine, CMA, am acting as scribe for Brendolyn Patty, MD.  Documentation: I have reviewed the above documentation for accuracy and completeness, and I agree with the above.  Brendolyn Patty MD

## 2020-10-27 ENCOUNTER — Other Ambulatory Visit: Payer: Self-pay

## 2020-10-27 ENCOUNTER — Ambulatory Visit (INDEPENDENT_AMBULATORY_CARE_PROVIDER_SITE_OTHER): Payer: Medicare Other | Admitting: Dermatology

## 2020-10-27 DIAGNOSIS — Z1283 Encounter for screening for malignant neoplasm of skin: Secondary | ICD-10-CM

## 2020-10-27 DIAGNOSIS — L309 Dermatitis, unspecified: Secondary | ICD-10-CM | POA: Diagnosis not present

## 2020-10-27 DIAGNOSIS — L821 Other seborrheic keratosis: Secondary | ICD-10-CM

## 2020-10-27 DIAGNOSIS — L738 Other specified follicular disorders: Secondary | ICD-10-CM

## 2020-10-27 DIAGNOSIS — D1800 Hemangioma unspecified site: Secondary | ICD-10-CM

## 2020-10-27 DIAGNOSIS — L820 Inflamed seborrheic keratosis: Secondary | ICD-10-CM | POA: Diagnosis not present

## 2020-10-27 DIAGNOSIS — Z85828 Personal history of other malignant neoplasm of skin: Secondary | ICD-10-CM

## 2020-10-27 DIAGNOSIS — L814 Other melanin hyperpigmentation: Secondary | ICD-10-CM

## 2020-10-27 DIAGNOSIS — L578 Other skin changes due to chronic exposure to nonionizing radiation: Secondary | ICD-10-CM

## 2020-10-27 DIAGNOSIS — D229 Melanocytic nevi, unspecified: Secondary | ICD-10-CM

## 2020-10-27 MED ORDER — TACROLIMUS 0.1 % EX OINT: TOPICAL_OINTMENT | Freq: Two times a day (BID) | CUTANEOUS | 3 refills | Status: DC

## 2020-10-27 NOTE — Patient Instructions (Addendum)

## 2020-10-27 NOTE — Progress Notes (Signed)
Follow-Up Visit   Subjective  Jeremy Gilbert is a 70 y.o. male who presents for the following: tbse (Patient here today for tbse. Patient states he still has some dermatitis on head and chest and is bothersome for him. Patient reports no new areas of concern. ). Patient was unable to get prescription for rash (tacrolimus) since last appointment due to insurance coverage.  Lidex scalp solution doesn't help, Free and clear shampoo (no zinc) doesn't help either.  Not able to find the kind with zinc.  He has ISKs frozen previous visit in scalp and chest which helped alleviate the itching temporarily, but itching has recurred.  He has a few scaly growths that get irritated and itch on trunk.   The following portions of the chart were reviewed this encounter and updated as appropriate:      Objective  Well appearing patient in no apparent distress; mood and affect are within normal limits.  A full examination was performed including scalp, head, eyes, ears, nose, lips, neck, chest, axillae, abdomen, back, buttocks, bilateral upper extremities, bilateral lower extremities, hands, feet, fingers, toes, fingernails, and toenails. All findings within normal limits unless otherwise noted below.  Objective  chest, face, and scalp: Erythematous papules/plaque of the upper chest no scale, mild scaling occipital scalp     Objective  left abdomen x1 , right posterior shoulder x1, right axilla x 1, lower sternum x3 , left clavicle x 2, (8): Erythematous keratotic or waxy stuck-on papule    Objective  left inferior alar rim: 4 mm flesh papule   right medial eyebrow: 3 mm flesh papule   Assessment & Plan  Dermatitis chest, face, and scalp  Not improving  Start Tacrolimus 0.1% ointment. Apply to itchy areas on face, scalp and body twice a day.  60g and 3 rf sent to [facility] rx card given today.    Patient prefers to discontinue fluocinonide solution to scalp for itching  since not helping.  Continue Free and Clear shampoo - samples given in office patient unable to get free and clear with zinc    Recommend mild soap and moisturizing cream 1-2 times daily.  Continue Dove soap and Aveeno.   Will consider biopsy of chest in future if still not clear.  Discussed adding PO anti-itch Qhs        Reordered Medications tacrolimus (PROTOPIC) 0.1 % ointment  Inflamed seborrheic keratosis (8) left abdomen x1 , right posterior shoulder x1, right axilla x 1, lower sternum x3 , left clavicle x 2,  Irritated   Prior to procedure, discussed risks of blister formation, small wound, skin dyspigmentation, or rare scar following cryotherapy.    Destruction of lesion - left abdomen x1 , right posterior shoulder x1, right axilla x 1, lower sternum x3 , left clavicle x 2,  Destruction method: cryotherapy   Informed consent: discussed and consent obtained   Lesion destroyed using liquid nitrogen: Yes   Region frozen until ice ball extended beyond lesion: Yes   Outcome: patient tolerated procedure well with no complications   Post-procedure details: wound care instructions given    Sebaceous hyperplasia of face (2) right medial eyebrow; left inferior alar rim  Vrs Nevus Benign-appearing.  Observation.  Call clinic for new or changing moles.  Recommend daily use of broad spectrum spf 30+ sunscreen to sun-exposed areas.    Lentigines - Scattered tan macules - Due to sun exposure - Benign-appering, observe - Recommend daily broad spectrum sunscreen SPF 30+ to  sun-exposed areas, reapply every 2 hours as needed. - Call for any changes  Seborrheic Keratoses - Stuck-on, waxy, tan-brown papules and plaques  - Discussed benign etiology and prognosis. - Observe - Call for any changes  Melanocytic Nevi  - Tan-brown and/or pink-flesh-colored symmetric macules and papules - Benign appearing on exam today - Observation - Call clinic for new or changing moles -  Recommend daily use of broad spectrum spf 30+ sunscreen to sun-exposed areas.   Hemangiomas - Red papules - Discussed benign nature - Observe - Call for any changes  Actinic Damage - Chronic, secondary to cumulative UV/sun exposure - diffuse scaly erythematous macules with underlying dyspigmentation - Recommend daily broad spectrum sunscreen SPF 30+ to sun-exposed areas, reapply every 2 hours as needed.  - Call for new or changing lesions.  History of Basal Cell Carcinoma of the Skin - No evidence of recurrence today left alar crease - Recommend regular full body skin exams - Recommend daily broad spectrum sunscreen SPF 30+ to sun-exposed areas, reapply every 2 hours as needed.  - Call if any new or changing lesions are noted between office visits  Skin cancer screening performed today.  Return in about 3 months (around 01/27/2021) for follow up on rash, 1 year tbse .  I, Ruthell Rummage, CMA, am acting as scribe for Brendolyn Patty, MD.  Documentation: I have reviewed the above documentation for accuracy and completeness, and I agree with the above.  Brendolyn Patty MD

## 2021-02-02 ENCOUNTER — Ambulatory Visit: Payer: Medicare Other | Admitting: Dermatology

## 2021-04-09 ENCOUNTER — Other Ambulatory Visit: Payer: Medicare Other

## 2021-11-02 ENCOUNTER — Ambulatory Visit (INDEPENDENT_AMBULATORY_CARE_PROVIDER_SITE_OTHER): Payer: Medicare Other | Admitting: Dermatology

## 2021-11-02 ENCOUNTER — Other Ambulatory Visit: Payer: Self-pay

## 2021-11-02 DIAGNOSIS — L820 Inflamed seborrheic keratosis: Secondary | ICD-10-CM | POA: Diagnosis not present

## 2021-11-02 DIAGNOSIS — B009 Herpesviral infection, unspecified: Secondary | ICD-10-CM

## 2021-11-02 DIAGNOSIS — L720 Epidermal cyst: Secondary | ICD-10-CM

## 2021-11-02 DIAGNOSIS — L219 Seborrheic dermatitis, unspecified: Secondary | ICD-10-CM

## 2021-11-02 DIAGNOSIS — L738 Other specified follicular disorders: Secondary | ICD-10-CM

## 2021-11-02 DIAGNOSIS — Z85828 Personal history of other malignant neoplasm of skin: Secondary | ICD-10-CM

## 2021-11-02 DIAGNOSIS — D2239 Melanocytic nevi of other parts of face: Secondary | ICD-10-CM

## 2021-11-02 DIAGNOSIS — D229 Melanocytic nevi, unspecified: Secondary | ICD-10-CM

## 2021-11-02 DIAGNOSIS — L821 Other seborrheic keratosis: Secondary | ICD-10-CM

## 2021-11-02 DIAGNOSIS — L570 Actinic keratosis: Secondary | ICD-10-CM

## 2021-11-02 DIAGNOSIS — Z1283 Encounter for screening for malignant neoplasm of skin: Secondary | ICD-10-CM

## 2021-11-02 DIAGNOSIS — L578 Other skin changes due to chronic exposure to nonionizing radiation: Secondary | ICD-10-CM

## 2021-11-02 DIAGNOSIS — D1800 Hemangioma unspecified site: Secondary | ICD-10-CM

## 2021-11-02 DIAGNOSIS — L814 Other melanin hyperpigmentation: Secondary | ICD-10-CM

## 2021-11-02 MED ORDER — VALACYCLOVIR HCL 1 G PO TABS: ORAL_TABLET | ORAL | 11 refills | Status: DC

## 2021-11-02 NOTE — Progress Notes (Signed)
? ?Follow-Up Visit ?  ?Subjective  ?Jeremy Gilbert is a 71 y.o. male who presents for the following: Follow-up (Patient here today for 1 year tbse. Patient reports some itchy spots at right side, spot at left hip that has grown in size, and some other areas he would like checked today. ). ? ?The patient presents for Upper Body Skin Exam (UBSE) for skin cancer screening and mole check.  The patient has spots, moles and lesions to be evaluated, some may be new or changing and the patient has concerns that these could be cancer. ? ?The following portions of the chart were reviewed this encounter and updated as appropriate:   ?  ? ?Review of Systems: No other skin or systemic complaints except as noted in HPI or Assessment and Plan. ? ? ?Objective  ?Well appearing patient in no apparent distress; mood and affect are within normal limits. ? ?All skin waist up examined. ? ?lips ?Clear today ? ?Right Flank x 1 ?Erythematous stuck-on, waxy papule ? ?left waistline ?1 cm rubbery SQ nodule  ? ?right lower back x 5, left anterior shoulder x1, left forearm x 1, right hand dorsum x 4, left nasal dorsum x 1, left hand dorsum x 2 (14) ?Pink scaly macules  ? ?Scalp ?Clear today ? ?right medial eyebrow ?4 mm flesh papule at right medial eyebrow ? ?left inferior alar rim ?4 mm flesh papule  ? ? 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 ?Patient with history of cold sores, requests refill of Valtrex ? ?Herpes Simplex Virus = Cold Sores = Fever Blisters is a chronic recurring blistering; scabbing sore-producing viral infection that is recurrent usually in the same area triggered by stress, sun/UV exposure and trauma.  It is infectious and can be spread from person to person by direct contact.  It is not curable, but is treatable with topical and oral medication.  ? ?Start Valtrex 1000 mg tablets - use as directed. Take 2 tablets by mouth at first onset of symptoms, then 2 tablets by mouth 12 hours later ? ? ? ?valACYclovir  (VALTREX) 1000 MG tablet - lips ?Use as directed. Take 2 tablets by mouth at first onset of symptoms and 2 tablets by mouth 12 hours later. ? ?Inflamed seborrheic keratosis ?Right Flank x 1 ? ?Irritated and bothers patient ? ?Destruction of lesion - Right Flank x 1 ? ?Destruction method: cryotherapy   ?Informed consent: discussed and consent obtained   ?Lesion destroyed using liquid nitrogen: Yes   ?Region frozen until ice ball extended beyond lesion: Yes   ?Outcome: patient tolerated procedure well with no complications   ?Post-procedure details: wound care instructions given   ?Additional details:  Prior to procedure, discussed risks of blister formation, small wound, skin dyspigmentation, or rare scar following cryotherapy. Recommend Vaseline ointment to treated areas while healing. ? ? ?Epidermoid cyst of skin ?left waistline ? ?Vs lipoma ? ?Benign-appearing. Exam most consistent with an epidermal inclusion cyst. Discussed that a cyst is a benign growth that can grow over time and sometimes get irritated or inflamed. Recommend observation if it is not bothersome. Discussed option of surgical excision to remove it if it is growing, symptomatic, or other changes noted. Please call for new or changing lesions so they can be evaluated. ? ? ? ?Actinic keratosis (14) ?right lower back x 5, left anterior shoulder x1, left forearm x 1, right hand dorsum x 4, left nasal dorsum x 1, left hand dorsum x 2 ? ?Vs isks  ? ?  Recheck right lower back  ? ?Actinic keratoses are precancerous spots that appear secondary to cumulative UV radiation exposure/sun exposure over time. They are chronic with expected duration over 1 year. A portion of actinic keratoses will progress to squamous cell carcinoma of the skin. It is not possible to reliably predict which spots will progress to skin cancer and so treatment is recommended to prevent development of skin cancer. ? ?Recommend daily broad spectrum sunscreen SPF 30+ to sun-exposed  areas, reapply every 2 hours as needed.  ?Recommend staying in the shade or wearing long sleeves, sun glasses (UVA+UVB protection) and wide brim hats (4-inch brim around the entire circumference of the hat). ?Call for new or changing lesions. ? ?Destruction of lesion - right lower back x 5, left anterior shoulder x1, left forearm x 1, right hand dorsum x 4, left nasal dorsum x 1, left hand dorsum x 2 ? ?Destruction method: cryotherapy   ?Informed consent: discussed and consent obtained   ?Lesion destroyed using liquid nitrogen: Yes   ?Region frozen until ice ball extended beyond lesion: Yes   ?Outcome: patient tolerated procedure well with no complications   ?Post-procedure details: wound care instructions given   ?Additional details:  Prior to procedure, discussed risks of blister formation, small wound, skin dyspigmentation, or rare scar following cryotherapy. Recommend Vaseline ointment to treated areas while healing. ? ? ?Seborrheic dermatitis ?Scalp ? ?Chronic condition with duration or expected duration over one year. Currently well-controlled. ? ? ?Seborrheic Dermatitis  ?-  is a chronic persistent rash characterized by pinkness and scaling most commonly of the mid face but also can occur on the scalp (dandruff), ears; mid chest, mid back and groin.  It tends to be exacerbated by stress and cooler weather.  People who have neurologic disease may experience new onset or exacerbation of existing seborrheic dermatitis.  The condition is not curable but treatable and can be controlled. ? ?H&S shampoo made itching worse. ?Continue Vanicream Free and Clear medicated shampoo with zinc - samples given in office patient  ? ? ? ? ?Sebaceous hyperplasia of face ?right medial eyebrow ? ?Vrs Nevus ? ?Benign-appearing.  Observation.  Call clinic for new or changing moles.  Recommend daily use of broad spectrum spf 30+ sunscreen to sun-exposed areas.  ? ?Nevus ?left inferior alar rim ? ?Benign-appearing.  Stable.  Observation.  Call clinic for new or changing moles.  Recommend daily use of broad spectrum spf 30+ sunscreen to sun-exposed areas.  ? ?Lentigines ?- Scattered tan macules ?- Due to sun exposure ?- Benign-appearing, observe ?- Recommend daily broad spectrum sunscreen SPF 30+ to sun-exposed areas, reapply every 2 hours as needed. ?- Call for any changes ? ?Seborrheic Keratoses ?- Stuck-on, waxy, tan-brown papules and/or plaques  at left waistline  ?- Benign-appearing ?- Discussed benign etiology and prognosis. ?- Observe ?- Call for any changes ? ?Melanocytic Nevi ?- Tan-brown and/or pink-flesh-colored symmetric macules and papules ?- Benign appearing on exam today ?- Observation ?- Call clinic for new or changing moles ?- Recommend daily use of broad spectrum spf 30+ sunscreen to sun-exposed areas.  ? ?Hemangiomas ?- Red papules ?- Discussed benign nature ?- Observe ?- Call for any changes ? ?Actinic Damage ?- Chronic condition, secondary to cumulative UV/sun exposure ?- diffuse scaly erythematous macules with underlying dyspigmentation ?- Recommend daily broad spectrum sunscreen SPF 30+ to sun-exposed areas, reapply every 2 hours as needed.  ?- Staying in the shade or wearing long sleeves, sun glasses (UVA+UVB protection) and wide brim  hats (4-inch brim around the entire circumference of the hat) are also recommended for sun protection.  ?- Call for new or changing lesions. ? ?History of Basal Cell Carcinoma of the Skin ?- No evidence of recurrence today  left alar crease 2005 ?- Recommend regular full body skin exams ?- Recommend daily broad spectrum sunscreen SPF 30+ to sun-exposed areas, reapply every 2 hours as needed.  ?- Call if any new or changing lesions are noted between office visits ? ?Skin cancer screening performed today. ?Return for 1 year tbse . ?I, Ruthell Rummage, CMA, am acting as scribe for Brendolyn Patty, MD. ? ?Documentation: I have reviewed the above documentation for accuracy and completeness,  and I agree with the above. ? ?Brendolyn Patty MD  ? ?

## 2021-11-02 NOTE — Patient Instructions (Addendum)
Actinic keratoses are precancerous spots that appear secondary to cumulative UV radiation exposure/sun exposure over time. They are chronic with expected duration over 1 year. A portion of actinic keratoses will progress to squamous cell carcinoma of the skin. It is not possible to reliably predict which spots will progress to skin cancer and so treatment is recommended to prevent development of skin cancer. ? ?Recommend daily broad spectrum sunscreen SPF 30+ to sun-exposed areas, reapply every 2 hours as needed.  ?Recommend staying in the shade or wearing long sleeves, sun glasses (UVA+UVB protection) and wide brim hats (4-inch brim around the entire circumference of the hat). ?Call for new or changing lesions.  ? ?Cryotherapy Aftercare ? ?Wash gently with soap and water everyday.   ?Apply Vaseline and Band-Aid daily until healed.  ? ?Seborrheic Keratosis ? ?What causes seborrheic keratoses? ?Seborrheic keratoses are harmless, common skin growths that first appear during adult life.  As time goes by, more growths appear.  Some people may develop a large number of them.  Seborrheic keratoses appear on both covered and uncovered body parts.  They are not caused by sunlight.  The tendency to develop seborrheic keratoses can be inherited.  They vary in color from skin-colored to gray, brown, or even black.  They can be either smooth or have a rough, warty surface.   ?Seborrheic keratoses are superficial and look as if they were stuck on the skin.  Under the microscope this type of keratosis looks like layers upon layers of skin.  That is why at times the top layer may seem to fall off, but the rest of the growth remains and re-grows.   ? ?Treatment ?Seborrheic keratoses do not need to be treated, but can easily be removed in the office.  Seborrheic keratoses often cause symptoms when they rub on clothing or jewelry.  Lesions can be in the way of shaving.  If they become inflamed, they can cause itching, soreness, or  burning.  Removal of a seborrheic keratosis can be accomplished by freezing, burning, or surgery. ?If any spot bleeds, scabs, or grows rapidly, please return to have it checked, as these can be an indication of a skin cancer. ? ? ? ? ?Melanoma ABCDEs ? ?Melanoma is the most dangerous type of skin cancer, and is the leading cause of death from skin disease.  You are more likely to develop melanoma if you: ?Have light-colored skin, light-colored eyes, or red or blond hair ?Spend a lot of time in the sun ?Tan regularly, either outdoors or in a tanning bed ?Have had blistering sunburns, especially during childhood ?Have a close family member who has had a melanoma ?Have atypical moles or large birthmarks ? ?Early detection of melanoma is key since treatment is typically straightforward and cure rates are extremely high if we catch it early.  ? ?The first sign of melanoma is often a change in a mole or a new dark spot.  The ABCDE system is a way of remembering the signs of melanoma. ? ?A for asymmetry:  The two halves do not match. ?B for border:  The edges of the growth are irregular. ?C for color:  A mixture of colors are present instead of an even brown color. ?D for diameter:  Melanomas are usually (but not always) greater than 62m - the size of a pencil eraser. ?E for evolution:  The spot keeps changing in size, shape, and color. ? ?Please check your skin once per month between visits. You can use  a small mirror in front and a large mirror behind you to keep an eye on the back side or your body.  ? ?If you see any new or changing lesions before your next follow-up, please call to schedule a visit. ? ?Please continue daily skin protection including broad spectrum sunscreen SPF 30+ to sun-exposed areas, reapplying every 2 hours as needed when you're outdoors.  ? ?Staying in the shade or wearing long sleeves, sun glasses (UVA+UVB protection) and wide brim hats (4-inch brim around the entire circumference of the hat)  are also recommended for sun protection.   ? ? ?If You Need Anything After Your Visit ? ?If you have any questions or concerns for your doctor, please call our main line at [phone] and press option 4 to reach your doctor's medical assistant. If no one answers, please leave a voicemail as directed and we will return your call as soon as possible. Messages left after 4 pm will be answered the following business day.  ? ?You may also send [place] a message via MyChart. We typically respond to MyChart messages within 1-2 business days. ? ?For prescription refills, please ask your pharmacy to contact our office. Our fax number is [phone]. ? ?If you have an urgent issue when the clinic is closed that cannot wait until the next business day, you can page your doctor at the number below.   ? ?Please note that while we do our best to be available for urgent issues outside of office hours, we are not available 24/7.  ? ?If you have an urgent issue and are unable to reach [place], you may choose to seek medical care at your doctor's office, retail clinic, urgent care center, or emergency room. ? ?If you have a medical emergency, please immediately call 911 or go to the emergency department. ? ?Pager Numbers ? ?- Dr. Nehemiah Massed: [phone] ? ?- Dr. Laurence Ferrari: [phone] ? ?- Dr. Nicole Kindred: [phone] ? ?In the event of inclement weather, please call our main line at [phone] for an update on the status of any delays or closures. ? ?Dermatology Medication Tips: ?Please keep the boxes that topical medications come in in order to help keep track of the instructions about where and how to use these. Pharmacies typically print the medication instructions only on the boxes and not directly on the medication tubes.  ? ?If your medication is too expensive, please contact our office at [phone] option 4 or send [place] a message through [place].  ? ?We are unable to tell what your co-pay for medications will be in advance as this is  different depending on your insurance coverage. However, we may be able to find a substitute medication at lower cost or fill out paperwork to get insurance to cover a needed medication.  ? ?If a prior authorization is required to get your medication covered by your insurance company, please allow [place] 1-2 business days to complete this process. ? ?Drug prices often vary depending on where the prescription is filled and some pharmacies may offer cheaper prices. ? ?The website www.goodrx.com contains coupons for medications through different pharmacies. The prices here do not account for what the cost may be with help from insurance (it may be cheaper with your insurance), but the website can give you the price if you did not use any insurance.  ?- You can print the associated coupon and take it with your prescription to the pharmacy.  ?- You may also stop by our office during regular  business hours and pick up a GoodRx coupon card.  ?- If you need your prescription sent electronically to a different pharmacy, notify our office through [facility] or by phone at [phone] option 4. ? ? ? ? ?Si Usted Necesita Algo Despu?s de Su Visita ? ?Tambi?n puede enviarnos un mensaje a trav?s de MyChart. Por lo general respondemos a los mensajes de MyChart en el transcurso de 1 a 2 d?as h?biles. ? ?Para renovar recetas, por favor pida a su farmacia que se ponga en contacto con nuestra oficina. Nuestro n?mero de fax es el [phone]. ? ?Si tiene un asunto urgente cuando la cl?nica est? cerrada y que no puede esperar hasta el siguiente d?a h?bil, puede llamar/localizar a su doctor(a) al n?mero que aparece a continuaci?n.  ? ?Por favor, tenga en cuenta que aunque hacemos todo lo posible para estar disponibles para asuntos urgentes fuera del horario de oficina, no estamos disponibles las 24 horas del d?a, los 7 d?as de la semana.  ? ?Si tiene un problema urgente y no puede comunicarse con nosotros, puede optar por buscar  atenci?n m?dica  en el consultorio de su doctor(a), en una cl?nica privada, en un centro de atenci?n urgente o en una sala de emergencias. ? ?Si tiene una emergencia m?dica, por favor llame inmediatamente al

## 2021-12-02 IMAGING — CT CT CHEST W/O CM
2 of 4 series · 15 of 36 positions shown, 18 images · non-contrast
Comparison: Multiple prior lung cancer screening CT scans. The most
recent is 03/27/2018

CLINICAL DATA: Followup pulmonary nodules.

EXAM:
CT CHEST WITHOUT CONTRAST
TECHNIQUE: Multidetector CT imaging of the chest was performed following the
standard protocol without IV contrast.

[Series 2: chest 2.00 · axial · 0.66mm/px · z∈[-1175,-915]mm · 12 of 154 slices shown, 15 images]
[im 12/154  mediastinal]
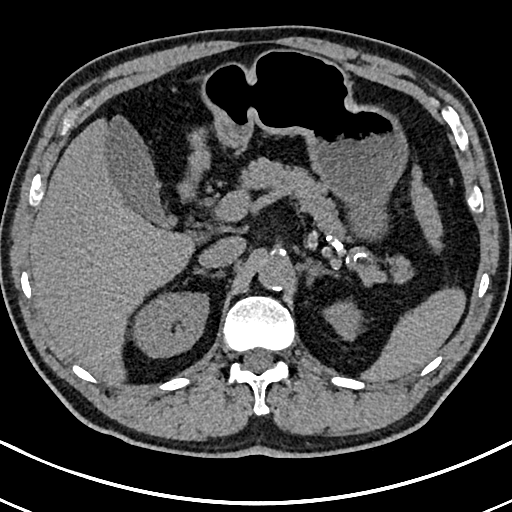
[im 12/154  lung]
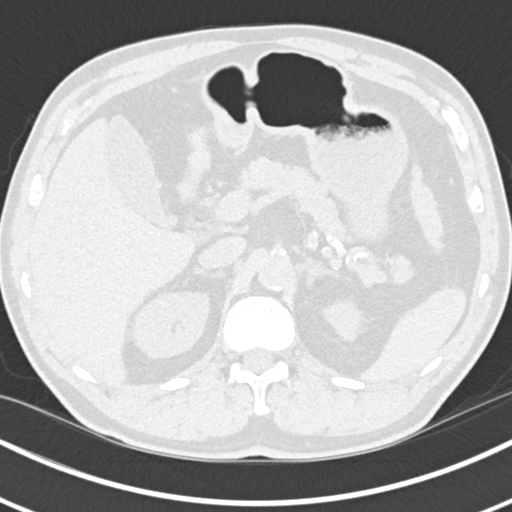
[im 24/154  lung]
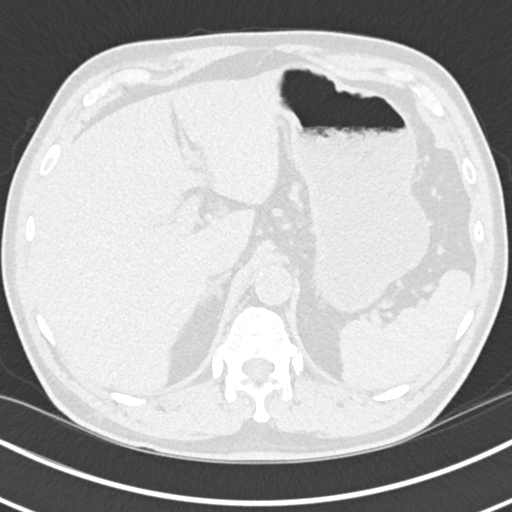
[im 36/154  lung]
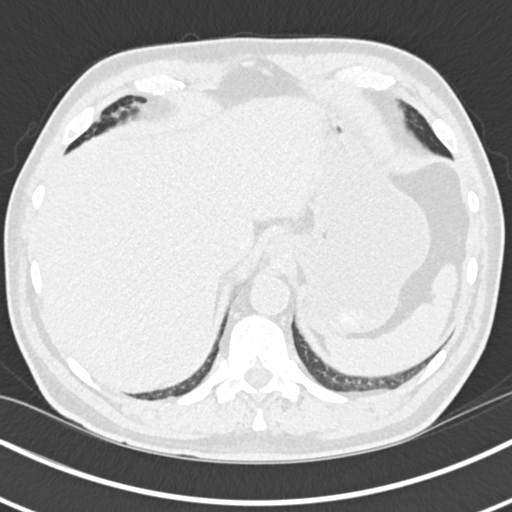
[im 48/154  lung]
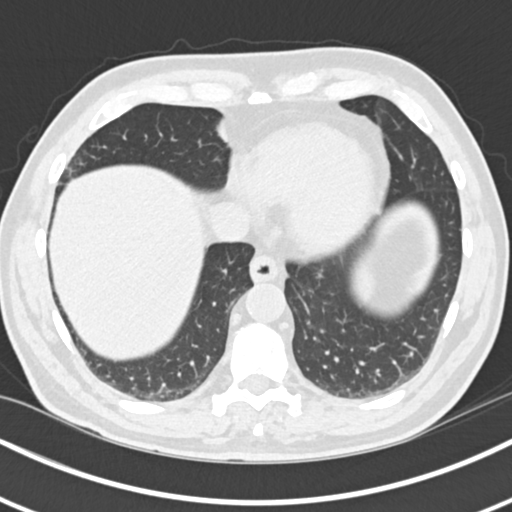
[im 59/154  mediastinal]
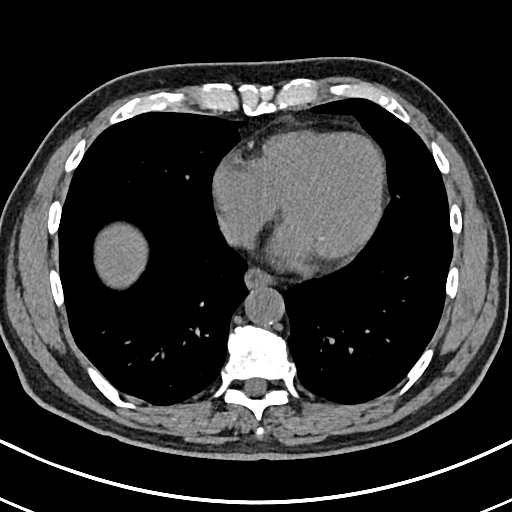
[im 59/154  lung]
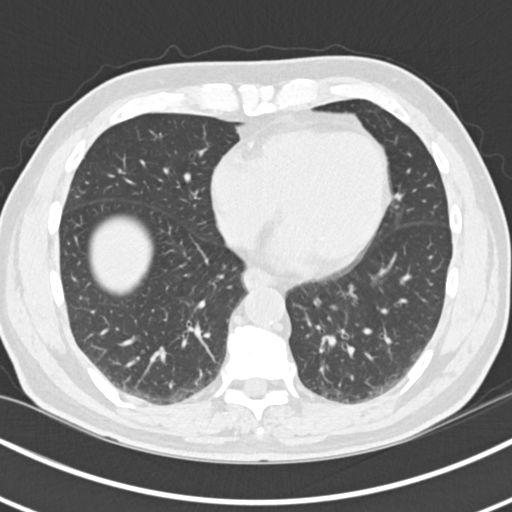
[im 71/154  lung]
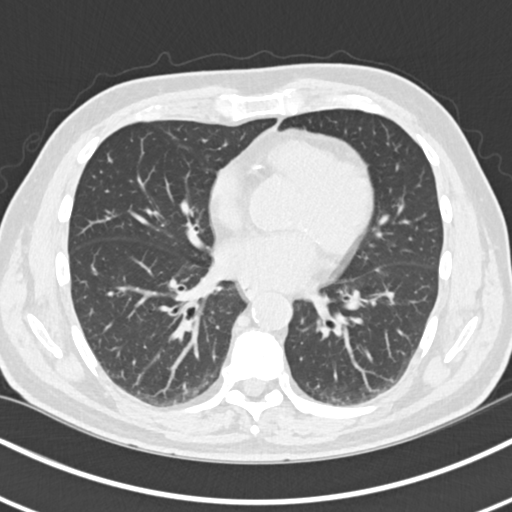
[im 83/154  lung]
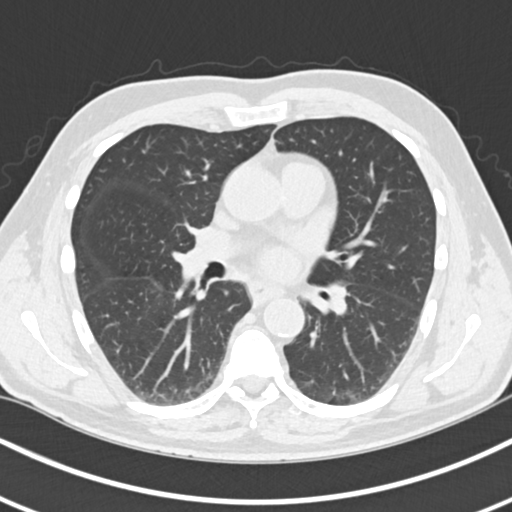
[im 95/154  lung]
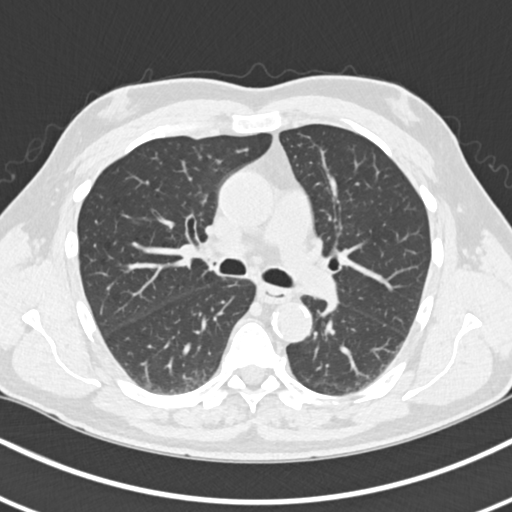
[im 106/154  mediastinal]
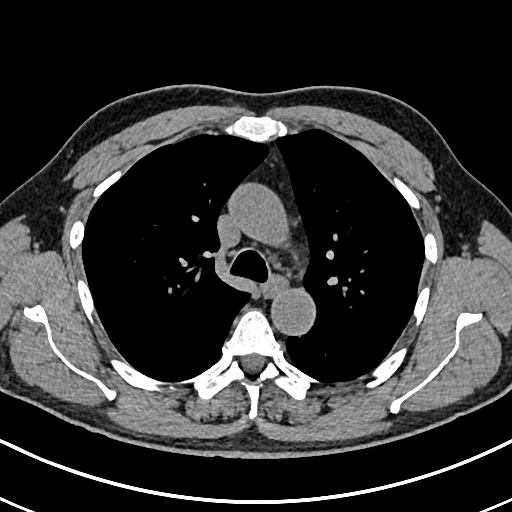
[im 106/154  lung]
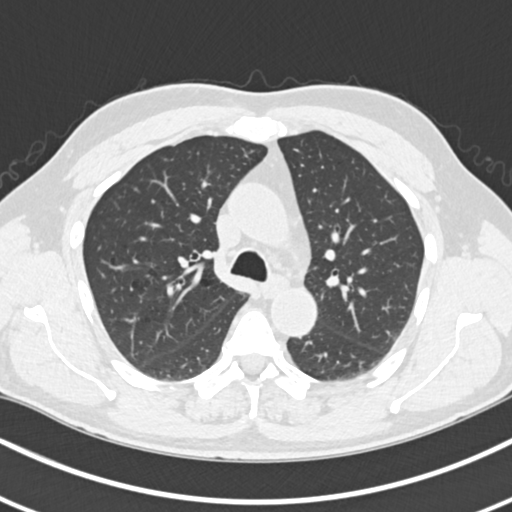
[im 118/154  lung]
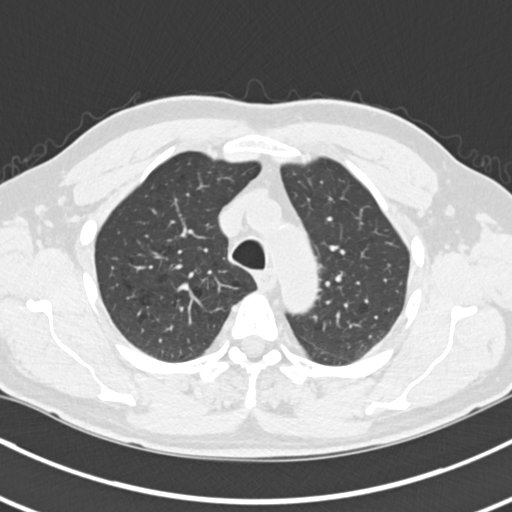
[im 130/154  lung]
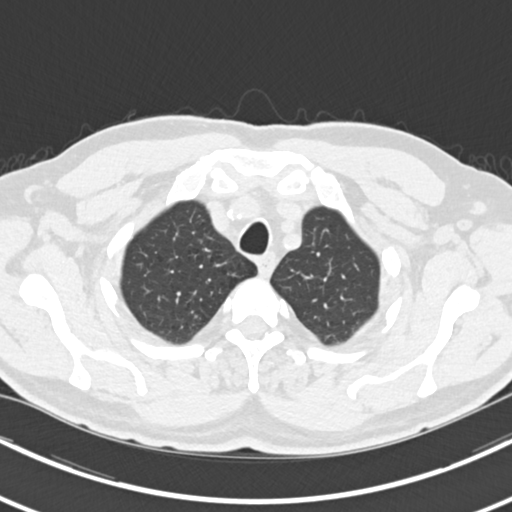
[im 142/154  lung]
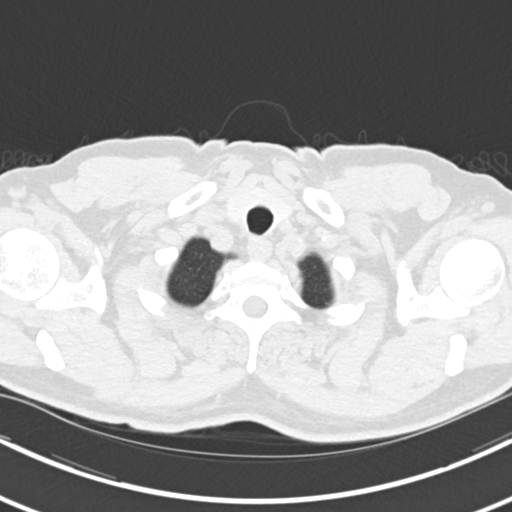

[Series 5: coronals chest 2.00 cor · coronal · 0.61mm/px · 3 of 144 slices shown]
[im 29/144  lung]
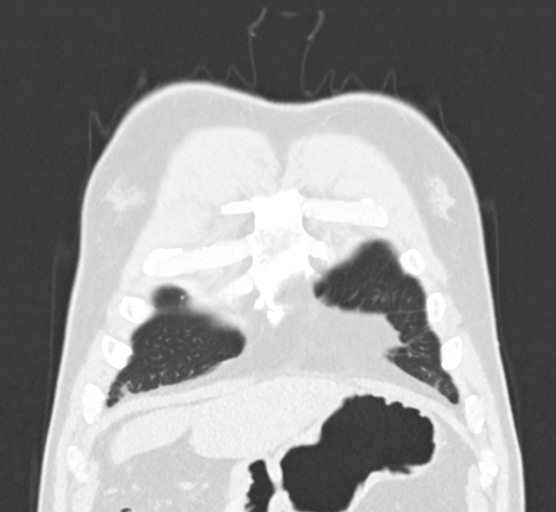
[im 58/144  lung]
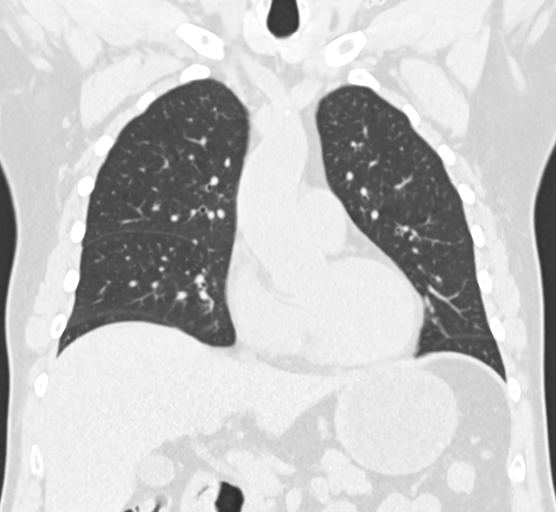
[im 86/144  lung]
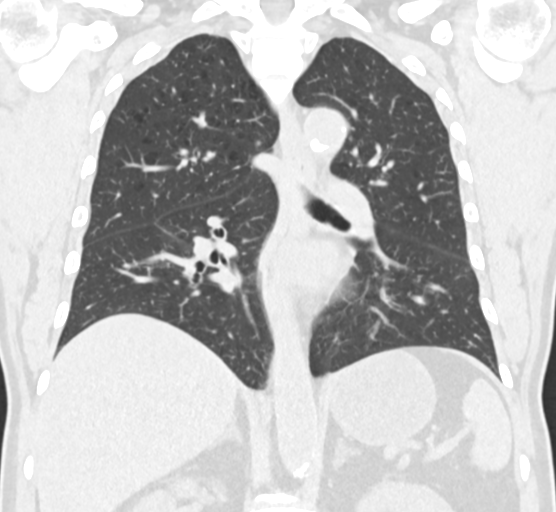

[15 of 36 positions shown; findings below may reference images not displayed]

FINDINGS: Cardiovascular: The heart is normal in size. No pericardial
effusion. Stable mild tortuosity and moderate atherosclerotic
calcification involving the thoracic aorta. No focal aneurysm.

Mediastinum/Nodes: Small scattered mediastinal and hilar lymph nodes
are stable. No mass or overt adenopathy. The esophagus is
unremarkable.

Lungs/Pleura: Stable underlying emphysematous changes. No acute
overlying pulmonary process.

A few tiny scattered subpleural nodules are stable. The 4 mm nodule
seen in the right upper lobe medially on the prior CT scan now
measures 2.5 mm.

No new pulmonary nodules or worrisome pulmonary lesions.

Upper Abdomen: No significant upper abdominal findings. Stable
scattered atherosclerotic calcifications. No worrisome hepatic or
adrenal gland lesions.

Musculoskeletal: No chest wall mass, supraclavicular or axillary
adenopathy. Slight increase and symmetric bilateral gynecomastia.
IMPRESSION: 1. Stable emphysematous changes and pulmonary scarring. No acute
overlying pulmonary process.
2. Stable tiny scattered subpleural nodules. No new pulmonary
nodules or worrisome pulmonary lesions. No interval change since
6352 consistent with benign disease.
3. No mediastinal or hilar mass or adenopathy.
4. Stable atherosclerotic calcifications involving the thoracic
aorta and branch vessels.
5. Emphysema and aortic atherosclerosis.

Aortic Atherosclerosis (8AOOV-H6H.H) and Emphysema (8AOOV-C9E.O).

## 2022-03-08 DIAGNOSIS — I2510 Atherosclerotic heart disease of native coronary artery without angina pectoris: Secondary | ICD-10-CM

## 2022-03-08 HISTORY — DX: Atherosclerotic heart disease of native coronary artery without angina pectoris: I25.10

## 2022-03-10 ENCOUNTER — Encounter: Payer: Self-pay | Admitting: Internal Medicine

## 2022-03-10 ENCOUNTER — Inpatient Hospital Stay
Admission: EM | Admit: 2022-03-10 | Discharge: 2022-03-11 | DRG: 282 | Disposition: A | Discharge status: Other Acute Inpatient Hospital | Payer: Medicare Other | Attending: Hospitalist | Admitting: Hospitalist

## 2022-03-10 ENCOUNTER — Inpatient Hospital Stay
Admit: 2022-03-10 | Discharge: 2022-03-10 | Disposition: A | Discharge status: Home / Self Care | Payer: Medicare Other | Attending: Internal Medicine | Admitting: Internal Medicine

## 2022-03-10 ENCOUNTER — Other Ambulatory Visit: Payer: Self-pay

## 2022-03-10 ENCOUNTER — Emergency Department: Payer: Medicare Other

## 2022-03-10 DIAGNOSIS — Z885 Allergy status to narcotic agent status: Secondary | ICD-10-CM

## 2022-03-10 DIAGNOSIS — E785 Hyperlipidemia, unspecified: Secondary | ICD-10-CM | POA: Diagnosis present

## 2022-03-10 DIAGNOSIS — R739 Hyperglycemia, unspecified: Secondary | ICD-10-CM

## 2022-03-10 DIAGNOSIS — E781 Pure hyperglyceridemia: Secondary | ICD-10-CM | POA: Diagnosis present

## 2022-03-10 DIAGNOSIS — I214 Non-ST elevation (NSTEMI) myocardial infarction: Principal | ICD-10-CM | POA: Diagnosis present

## 2022-03-10 DIAGNOSIS — Z79899 Other long term (current) drug therapy: Secondary | ICD-10-CM | POA: Diagnosis not present

## 2022-03-10 DIAGNOSIS — E1165 Type 2 diabetes mellitus with hyperglycemia: Secondary | ICD-10-CM | POA: Diagnosis present

## 2022-03-10 DIAGNOSIS — I209 Angina pectoris, unspecified: Secondary | ICD-10-CM | POA: Diagnosis present

## 2022-03-10 DIAGNOSIS — Z888 Allergy status to other drugs, medicaments and biological substances status: Secondary | ICD-10-CM

## 2022-03-10 DIAGNOSIS — E1169 Type 2 diabetes mellitus with other specified complication: Secondary | ICD-10-CM | POA: Diagnosis present

## 2022-03-10 DIAGNOSIS — Z85828 Personal history of other malignant neoplasm of skin: Secondary | ICD-10-CM | POA: Diagnosis not present

## 2022-03-10 DIAGNOSIS — I10 Essential (primary) hypertension: Secondary | ICD-10-CM | POA: Diagnosis present

## 2022-03-10 DIAGNOSIS — E119 Type 2 diabetes mellitus without complications: Secondary | ICD-10-CM

## 2022-03-10 LAB — BASIC METABOLIC PANEL
Anion gap: 11 (ref 5–15)
BUN: 23 mg/dL (ref 8–23)
CO2: 22 mmol/L (ref 22–32)
Calcium: 9.2 mg/dL (ref 8.9–10.3)
Chloride: 101 mmol/L (ref 98–111)
Creatinine, Ser: 1.3 mg/dL — ABNORMAL HIGH (ref 0.61–1.24)
GFR, Estimated: 58 mL/min — ABNORMAL LOW (ref >60)
Glucose, Bld: 288 mg/dL — ABNORMAL HIGH (ref 70–99)
Potassium: 3.6 mmol/L (ref 3.5–5.1)
Sodium: 134 mmol/L — ABNORMAL LOW (ref 135–145)

## 2022-03-10 LAB — ECHOCARDIOGRAM COMPLETE
AR max vel: 2.2 cm2
AV Area VTI: 2.19 cm2
AV Area mean vel: 1.97 cm2
AV Mean grad: 3 mmHg
AV Peak grad: 5.1 mmHg
Ao pk vel: 1.13 m/s
Area-P 1/2: 2.63 cm2
Calc EF: 54.1 %
Height: 67 in
S' Lateral: 3.74 cm
Single Plane A2C EF: 54.5 %
Single Plane A4C EF: 53.3 %
Weight: 2560.86 oz

## 2022-03-10 LAB — CBC
HCT: 49.3 % (ref 39.0–52.0)
Hemoglobin: 16.6 g/dL (ref 13.0–17.0)
MCH: 31.3 pg (ref 26.0–34.0)
MCHC: 33.7 g/dL (ref 30.0–36.0)
MCV: 92.8 fL (ref 80.0–100.0)
Platelets: 249 10*3/uL (ref 150–400)
RBC: 5.31 MIL/uL (ref 4.22–5.81)
RDW: 12.4 % (ref 11.5–15.5)
WBC: 8.3 10*3/uL (ref 4.0–10.5)
nRBC: 0 % (ref 0.0–0.2)

## 2022-03-10 LAB — TROPONIN I (HIGH SENSITIVITY)
Troponin I (High Sensitivity): 298 ng/L (ref <18)
Troponin I (High Sensitivity): 563 ng/L (ref <18)
Troponin I (High Sensitivity): 919 ng/L (ref <18)
Troponin I (High Sensitivity): 1256 ng/L (ref <18)

## 2022-03-10 LAB — GLUCOSE, CAPILLARY
Glucose-Capillary: 108 mg/dL — ABNORMAL HIGH (ref 70–99)
Glucose-Capillary: 132 mg/dL — ABNORMAL HIGH (ref 70–99)
Glucose-Capillary: 128 mg/dL — ABNORMAL HIGH (ref 70–99)

## 2022-03-10 LAB — HEPATIC FUNCTION PANEL
ALT: 23 U/L (ref 0–44)
AST: 33 U/L (ref 15–41)
Albumin: 4.4 g/dL (ref 3.5–5.0)
Alkaline Phosphatase: 95 U/L (ref 38–126)
Bilirubin, Direct: 0.1 mg/dL (ref 0.0–0.2)
Indirect Bilirubin: 0.8 mg/dL (ref 0.3–0.9)
Total Bilirubin: 0.9 mg/dL (ref 0.3–1.2)
Total Protein: 8.1 g/dL (ref 6.5–8.1)

## 2022-03-10 LAB — PROTIME-INR
INR: 1 (ref 0.8–1.2)
Prothrombin Time: 13.2 seconds (ref 11.4–15.2)

## 2022-03-10 LAB — TSH: TSH: 0.951 u[IU]/mL (ref 0.350–4.500)

## 2022-03-10 LAB — HEPARIN LEVEL (UNFRACTIONATED): Heparin Unfractionated: 0.19 IU/mL — ABNORMAL LOW (ref 0.30–0.70)

## 2022-03-10 LAB — APTT: aPTT: 28 seconds (ref 24–36)

## 2022-03-10 LAB — MAGNESIUM: Magnesium: 2.2 mg/dL (ref 1.7–2.4)

## 2022-03-10 LAB — HEMOGLOBIN A1C
Hgb A1c MFr Bld: 6.1 % — ABNORMAL HIGH (ref 4.8–5.6)
Mean Plasma Glucose: 128.37 mg/dL

## 2022-03-10 MED ORDER — HEPARIN BOLUS VIA INFUSION
4000.0000 [IU] | Freq: Once | INTRAVENOUS | Status: AC
Start: 2022-03-10 — End: 2022-03-10
  Administered 2022-03-10: 4000 [IU] via INTRAVENOUS
  Filled 2022-03-10: qty 4000

## 2022-03-10 MED ORDER — HEPARIN (PORCINE) 25000 UT/250ML-% IV SOLN
1150.0000 [IU]/h | INTRAVENOUS | Status: DC
Start: 2022-03-10 — End: 2022-03-11
  Administered 2022-03-10: 800 [IU]/h via INTRAVENOUS
  Administered 2022-03-11: 1150 [IU]/h via INTRAVENOUS
  Filled 2022-03-10 (×2): qty 250

## 2022-03-10 MED ORDER — LACTATED RINGERS IV BOLUS
1000.0000 mL | Freq: Once | INTRAVENOUS | Status: AC
  Administered 2022-03-10: 1000 mL via INTRAVENOUS

## 2022-03-10 MED ORDER — ASPIRIN 81 MG PO CHEW
324.0000 mg | CHEWABLE_TABLET | Freq: Once | ORAL | Status: AC
  Administered 2022-03-10: 324 mg via ORAL
  Filled 2022-03-10: qty 4

## 2022-03-10 MED ORDER — HEPARIN BOLUS VIA INFUSION
2000.0000 [IU] | Freq: Once | INTRAVENOUS | Status: AC
  Administered 2022-03-10: 2000 [IU] via INTRAVENOUS
  Filled 2022-03-10: qty 2000

## 2022-03-10 MED ORDER — HYDROCHLOROTHIAZIDE 12.5 MG PO TABS
12.5000 mg | ORAL_TABLET | Freq: Every day | ORAL | Status: DC
  Filled 2022-03-10: qty 1

## 2022-03-10 MED ORDER — SODIUM CHLORIDE 0.9 % IV SOLN: 250.0000 mL | INTRAVENOUS | Status: DC | PRN

## 2022-03-10 MED ORDER — LOSARTAN POTASSIUM 50 MG PO TABS
100.0000 mg | ORAL_TABLET | Freq: Every day | ORAL | Status: DC
  Filled 2022-03-10: qty 2

## 2022-03-10 MED ORDER — METOPROLOL SUCCINATE ER 100 MG PO TB24
100.0000 mg | ORAL_TABLET | Freq: Two times a day (BID) | ORAL | Status: DC
  Administered 2022-03-10: 100 mg via ORAL
  Filled 2022-03-10 (×2): qty 1

## 2022-03-10 MED ORDER — SODIUM CHLORIDE 0.9 % IV SOLN
INTRAVENOUS | Status: DC
Start: 2022-03-11 — End: 2022-03-11
  Administered 2022-03-11: 10 mL/h via INTRAVENOUS

## 2022-03-10 MED ORDER — LOSARTAN POTASSIUM-HCTZ 100-12.5 MG PO TABS
1.0000 | ORAL_TABLET | Freq: Every day | ORAL | Status: DC
Start: 2022-03-10 — End: 2022-03-10

## 2022-03-10 MED ORDER — ASPIRIN 81 MG PO TBEC
81.0000 mg | DELAYED_RELEASE_TABLET | Freq: Every day | ORAL | Status: DC
  Administered 2022-03-11: 81 mg via ORAL
  Filled 2022-03-10: qty 1

## 2022-03-10 MED ORDER — NITROGLYCERIN 0.4 MG SL SUBL
0.4000 mg | SUBLINGUAL_TABLET | SUBLINGUAL | Status: DC | PRN
  Administered 2022-03-10: 0.4 mg via SUBLINGUAL
  Filled 2022-03-10 (×2): qty 1

## 2022-03-10 MED ORDER — ATORVASTATIN CALCIUM 20 MG PO TABS
40.0000 mg | ORAL_TABLET | Freq: Every day | ORAL | Status: DC
Start: 2022-03-10 — End: 2022-03-11
  Administered 2022-03-10: 40 mg via ORAL
  Filled 2022-03-10 (×2): qty 2

## 2022-03-10 MED ORDER — ONDANSETRON HCL 4 MG/2ML IJ SOLN: 4.0000 mg | Freq: Four times a day (QID) | INTRAMUSCULAR | Status: DC | PRN

## 2022-03-10 MED ORDER — SODIUM CHLORIDE 0.9% FLUSH: 3.0000 mL | INTRAVENOUS | Status: DC | PRN

## 2022-03-10 MED ORDER — SODIUM CHLORIDE 0.9% FLUSH: 3.0000 mL | Freq: Two times a day (BID) | INTRAVENOUS | Status: DC

## 2022-03-10 MED ORDER — ORAL CARE MOUTH RINSE
15.0000 mL | OROMUCOSAL | Status: DC | PRN
Start: 2022-03-10 — End: 2022-03-11

## 2022-03-10 MED ORDER — NITROGLYCERIN 0.4 MG SL SUBL: 0.4000 mg | SUBLINGUAL_TABLET | SUBLINGUAL | Status: DC | PRN

## 2022-03-10 NOTE — Assessment & Plan Note (Signed)
Continue metoprolol, losartan and HCTZ

## 2022-03-10 NOTE — ED Notes (Signed)
ED Provider at bedside. 

## 2022-03-10 NOTE — Assessment & Plan Note (Signed)
Patient presents for evaluation of 4 days of intermittent midsternal chest pain with radiation to both arms associated with diaphoresis. Patient's risk factors include diabetes mellitus and dyslipidemia Initial troponin is elevated at 288 We will cycle cardiac enzymes Continue heparin drip initiated in the ER Place patient on aspirin, high intensity statin Continue metoprolol Obtain 2D echocardiogram to assess LVEF and rule out regional wall motion abnormality Consult cardiology

## 2022-03-10 NOTE — Consult Note (Signed)
ANTICOAGULATION CONSULT NOTE - Initial Consult  Pharmacy Consult for Heparin infusion Indication: chest pain/ACS  Allergies  Allergen Reactions   Ace Inhibitors Cough   Hydrocodone-Acetaminophen Itching   Oxycodone-Acetaminophen Other (See Comments)    shaking    Patient Measurements: Height: '5\' 7"'$  (170.2 cm) Weight: 74 kg (163 lb 1.8 oz) IBW/kg (Calculated) : 66.1 Heparin Dosing Weight: 72.6 kg  Vital Signs: Temp: 98 F (36.7 C) (08/03 1352) Temp Source: Oral (08/03 1352) BP: 130/71 (08/03 1544) Pulse Rate: 70 (08/03 1544)  Labs: Recent Labs    03/10/22 1010 03/10/22 1355 03/10/22 1606 03/10/22 1750  HGB 16.6  --   --   --   HCT 49.3  --   --   --   PLT 249  --   --   --   APTT 28  --   --   --   LABPROT 13.2  --   --   --   INR 1.0  --   --   --   HEPARINUNFRC  --   --   --  0.19*  CREATININE 1.30*  --   --   --   TROPONINIHS 298* 563* 919* 1,256*    Estimated Creatinine Clearance: 48 mL/min (A) (by C-G formula based on SCr of 1.3 mg/dL (H)).  Medical History: Past Medical History:  Diagnosis Date   Actinic keratosis    Basal cell carcinoma 2005   L alar crease   Tobacco abuse 01/27/2015    Medications:  NO DOAC prior to admission.  Assessment: HTN, DM (patient states he is prediabetic although A1c from 5/2 is 6.6), and hypertriglyceridemia. Noted LDL 85, TG 260. Pharmacy consulted to manage heparin infusion for ACS/NSTEMI.  8/3 1750 HL=0.19 Subtherapeutic increase from 800 u/hr to 1000 unit/hr   Goal of Therapy:  Heparin level 0.3-0.7 units/ml Monitor platelets by anticoagulation protocol: Yes   Plan:  Give 4000 units bolus x 1 Start heparin infusion at 800 units/hr Check anti-Xa level in 8 hours and daily while on heparin Continue to monitor H&H and platelets  8/3 1750 HL=0.19 Subtherapeutic Will order bolus of 2000 units x 1 and increase from 800 u/hr to 1000 unit/hr F/u heparin level in 8 hrs CBC daily  Chinita Greenland  PharmD Clinical Pharmacist 03/10/2022

## 2022-03-10 NOTE — Consult Note (Signed)
CARDIOLOGY CONSULT NOTE               Patient ID: Jeremy Gilbert MRN: [id number] DOB/AGE: 10-27-1950 71 y.o.  Admit date: 03/10/2022 Referring Physician Dr Francine Graven hospitalist Primary Physician Dr Lovie Macadamia  Primary Cardiologist  Reason for Consultation NSTEMI/[place]  HPI: Patient presented with with history of hypertension borderline diabetes smoking presents with multiple days of chest discomfort.  Patient states intermittent sweating diaphoresis chest discomfort arm pain patient saw his provider seen in urgent care and had limited work-up including laboratory lipids pain with recurrent the patient finally came to emergency room EKG had some diffuse ST changes patient was advised to be admitted for further evaluation and care initial troponins were 300  Review of systems complete and found to be negative unless listed above     Past Medical History:  Diagnosis Date   Actinic keratosis    Basal cell carcinoma 2005   L alar crease   Tobacco abuse 01/27/2015    No past surgical history on file.  Medications Prior to Admission  Medication Sig Dispense Refill Last Dose   acetaminophen (TYLENOL) 500 MG tablet Take 500 mg by mouth every 6 (six) hours as needed for mild pain, moderate pain, fever or headache.   03/10/2022 at 0200   losartan-hydrochlorothiazide (HYZAAR) 100-12.5 MG tablet Take 1 tablet by mouth daily.   03/10/2022 at 0800   metoprolol succinate (TOPROL-XL) 100 MG 24 hr tablet Take 1 tablet by mouth 2 (two) times daily.   03/10/2022 at 0800   losartan (COZAAR) 100 MG tablet Take 100 mg by mouth once. (Patient not taking: Reported on 03/10/2022)   Not Taking   metoprolol succinate (TOPROL-XL) 100 MG 24 hr tablet Take 200 mg by mouth once. (Patient not taking: Reported on 03/10/2022)   Not Taking   tacrolimus (PROTOPIC) 0.1 % ointment Apply topically in the morning and at bedtime. (Patient not taking: Reported on 03/10/2022) 60 g 3 Not Taking   valACYclovir (VALTREX) 1000 MG tablet Use  as directed. Take 2 tablets by mouth at first onset of symptoms and 2 tablets by mouth 12 hours later. 30 tablet 11 PRN at PRN   Social History   Socioeconomic History   Marital status: Married    Spouse name: Not on file   Number of children: Not on file   Years of education: Not on file   Highest education level: Not on file  Occupational History   Not on file  Tobacco Use   Smoking status: Not on file   Smokeless tobacco: Not on file  Substance and Sexual Activity   Alcohol use: Not on file   Drug use: Not on file   Sexual activity: Not on file  Other Topics Concern   Not on file  Social History Narrative   Not on file   Social Determinants of Health   Financial Resource Strain: Not on file  Food Insecurity: Not on file  Transportation Needs: Not on file  Physical Activity: Not on file  Stress: Not on file  Social Connections: Not on file  Intimate Partner Violence: Not on file    No family history on file.    Review of systems complete and found to be negative unless listed above      PHYSICAL EXAM  General: Well developed, well nourished, in no acute distress HEENT:  Normocephalic and atramatic Neck:  No JVD.  Lungs: Clear bilaterally to auscultation and percussion. Heart: HRRR . Normal S1 and S2  without gallops or murmurs.  Abdomen: Bowel sounds are positive, abdomen soft and non-tender  Msk:  Back normal, normal gait. Normal strength and tone for age. Extremities: No clubbing, cyanosis or edema.   Neuro: Alert and oriented X 3. Psych:  Good affect, responds appropriately  Labs:   Lab Results  Component Value Date   WBC 8.3 03/10/2022   HGB 16.6 03/10/2022   HCT 49.3 03/10/2022   MCV 92.8 03/10/2022   PLT 249 03/10/2022    Recent Labs  Lab 03/10/22 1010  NA 134*  K 3.6  CL 101  CO2 22  BUN 23  CREATININE 1.30*  CALCIUM 9.2  PROT 8.1  BILITOT 0.9  ALKPHOS 95  ALT 23  AST 33  GLUCOSE 288*   No results found for: "CKTOTAL", "CKMB",  "CKMBINDEX", "TROPONINI" No results found for: "CHOL" No results found for: "HDL" No results found for: "LDLCALC" No results found for: "TRIG" No results found for: "CHOLHDL" No results found for: "LDLDIRECT"    Radiology: DG Chest 2 View  Result Date: 03/10/2022 CLINICAL DATA:  A 71 year old male presents with chest pain. EXAM: CHEST - 2 VIEW COMPARISON:  CT of the chest from September of 2021. FINDINGS: The heart size and mediastinal contours are within normal limits. Both lungs are clear. The visualized skeletal structures are unremarkable. IMPRESSION: No active cardiopulmonary disease. Electronically Signed   By: Zetta Bills M.D.   On: 03/10/2022 10:38    EKG: NSR 75 nsstw  ASSESSMENT AND PLAN:  NSTEMI Chest Pain Smoking HTN Hyperlipidemia DM diet . Plan ROMI F/U tele Agree with anticoug with heparin Continue ASA 81 daily Continue troponin and ekg DM diet exercise ECHO for angina sob  Continue statin therapy Advise patient to refrain from smoking Consider cardiac cath    Signed: Yolonda Kida MD, 03/10/2022, 2:07 PM

## 2022-03-10 NOTE — ED Notes (Signed)
Pt in Rad

## 2022-03-10 NOTE — Assessment & Plan Note (Signed)
Patient noted to have hypertriglyceridemia (Triglyceride 260 from 12/07/21) We will start patient on atorvastatin

## 2022-03-10 NOTE — ED Provider Notes (Addendum)
[facility] Provider Note    Event Date/Time   First MD Initiated Contact with Patient 03/10/22 1000     (approximate)   History   Chest Pain and Hypertension   HPI  Jeremy Gilbert is a 71 y.o. male with a past medical history of HTN, DM (patient states he is prediabetic although A1c from 5/2 is 6.6), and hypertriglyceridemia with lipid panel from 5/2 showing triglycerides of 260 elevated states he has not treated or aware of any high cholesterol who presents for evaluation of chest pressure that he describes as "uncomfortable" that radiates across his chest towards both arms.  He states it is episodic and will happen up to a dozen times a day over the last 5 days.  He states it is closely associate with high blood pressure and his systolic blood pressures often reach around 200 when he is feeling most uncomfortable and that will slowly come out of the next 5 to 10 minutes.  States he had a particular episode last night prompting evaluation today.  He did see his PCP 2 days ago but does not have the results of any blood work done at that time.  He denies any current pain, associated fevers, cough, shortness of breath, headache or earache, sore throat, nausea, vomiting or diarrhea.  He drinks 2-3 alcoholic beverages per night.  He endorses remote tobacco abuse but none recently and denies any illicit drug use.  States he had a stress test over a decade ago but does not remember the results of this and has not had any other subsequent cardiac work-up.    Past Medical History:  Diagnosis Date   Actinic keratosis    Basal cell carcinoma 2005   L alar crease   Tobacco abuse 01/27/2015     Physical Exam  Triage Vital Signs: ED Triage Vitals [03/10/22 1009]  Enc Vitals Group     BP (!) 164/78     Pulse Rate 73     Resp 18     Temp 97.8 F (36.6 C)     Temp Source Oral     SpO2 100 %     Weight 160 lb 0.9 oz (72.6 kg)     Height '5\' 7"'$  (1.702 m)     Head  Circumference      Peak Flow      Pain Score 3     Pain Loc      Pain Edu?      Excl. in [place]?     Most recent vital signs: Vitals:   03/10/22 1009  BP: (!) 164/78  Pulse: 73  Resp: 18  Temp: 97.8 F (36.6 C)  SpO2: 100%    General: Awake, no distress.  CV:  Good peripheral perfusion.  Resp:  Normal effort.  Abd:  No distention.  Other:     ED Results / Procedures / Treatments  Labs (all labs ordered are listed, but only abnormal results are displayed) Labs Reviewed  BASIC METABOLIC PANEL - Abnormal; Notable for the following components:      Result Value   Sodium 134 (*)    Glucose, Bld 288 (*)    Creatinine, Ser 1.30 (*)    GFR, Estimated 58 (*)    All other components within normal limits  TROPONIN I (HIGH SENSITIVITY) - Abnormal; Notable for the following components:   Troponin I (High Sensitivity) 298 (*)    All other components within normal limits  CBC  MAGNESIUM  TSH  HEPATIC FUNCTION PANEL  APTT  PROTIME-INR  HEMOGLOBIN A1C     EKG  EKG is remarkable for sinus rhythm with a ventricular rate of 69, normal axis, nonspecific ST depression in V5 and V6 as well as lead II and aVF with some nonspecific depression and T wave changes in lead III without other clearance of acute ischemia.   RADIOLOGY Chest reviewed by myself shows no focal consoidation, effusion, edema, pneumothorax or other clear acute thoracic process. I also reviewed radiology interpretation and agree with findings described.    PROCEDURES:  Critical Care performed: Yes, see critical care procedure note(s)  .Critical Care  Performed by: Lucrezia Starch, MD Authorized by: Lucrezia Starch, MD   Critical care provider statement:    Critical care time (minutes):  30   Critical care was necessary to treat or prevent imminent or life-threatening deterioration of the following conditions:  Cardiac failure   Critical care was time spent personally by me on the following activities:   Development of treatment plan with patient or surrogate, discussions with consultants, evaluation of patient's response to treatment, examination of patient, ordering and review of laboratory studies, ordering and review of radiographic studies, ordering and performing treatments and interventions, pulse oximetry, re-evaluation of patient's condition and review of old charts   MEDICATIONS ORDERED IN ED: Medications  aspirin chewable tablet 324 mg (has no administration in time range)  nitroGLYCERIN (NITROSTAT) SL tablet 0.4 mg (has no administration in time range)  lactated ringers bolus 1,000 mL (has no administration in time range)     IMPRESSION / MDM / ASSESSMENT AND PLAN / ED COURSE  I reviewed the triage vital signs and the nursing notes. Patient's presentation is most consistent with acute presentation with potential threat to life or bodily function.                               Differential diagnosis includes, but is not limited to ACS, anemia, arrhythmia, Bolick derangements, symptomatic emergency versus possibly symptomatic hypertension during episodes of high blood pressure associated with chest pressure.  They are episodic and not associate with shortness of breath and is not hypoxic tachypneic or tachycardic and has no asymmetric lower extremity edema or swelling or other clear historical or exam features that would suggest a PE at this time.  Given episodic nature patient currently pain-free, lower suspicion for dissection.  EKG is remarkable for sinus rhythm with a ventricular rate of 69, normal axis, nonspecific ST depression in V5 and V6 as well as lead II and aVF with some nonspecific depression and T wave changes in lead III without other clearance of acute ischemia.  Initial troponin elevated to 98.  This is concerning for NSTEMI.  CBC without leukocytosis or acute anemia.  BMP is remarkable for glucose of 288 and a creatinine of 1.3 without other significant lecture light  or metabolic derangements.  TSH is WNL.  [place] function panel unremarkable.  PTT 28.  INR 1.  Chest reviewed by myself shows no focal consoidation, effusion, edema, pneumothorax or other clear acute thoracic process. I also reviewed radiology interpretation and agree with findings described.  Given concern for NSTEMI we will give ASA and start on heparin.  I will admit to medicine service for further evaluation and management.  I did also reach out and spoke with on-call cardiologist Dr. Clayborn Bigness who will see patient today    FINAL CLINICAL IMPRESSION(S) /  ED DIAGNOSES   Final diagnoses:  NSTEMI (non-ST elevated myocardial infarction) ([place])  Hyperglycemia     Rx / DC Orders   ED Discharge Orders     None        Note:  This document was prepared using Dragon voice recognition software and may include unintentional dictation errors.   Lucrezia Starch, MD 03/10/22 1119    Lucrezia Starch, MD 03/10/22 1126

## 2022-03-10 NOTE — ED Triage Notes (Signed)
Pt here with CP and HTN since Sun. Pt was told to come to ED by his primary. Pt states pain is all over his chest and radiates to both arms. Pt states his bp has been high at home, takes bp meds.

## 2022-03-10 NOTE — H&P (Signed)
History and Physical    Patient: Jeremy Gilbert OZH:[id number] DOB: 08/05/1951 DOA: 03/10/2022 DOS: the patient was seen and examined on 03/10/2022 PCP: Juluis Pitch, MD  Patient coming from: Home  Chief Complaint:  Chief Complaint  Patient presents with   Chest Pain   Hypertension   HPI: GIANKARLO LEAMER is a 71 y.o. male with medical history significant for hypertension, borderline diabetes mellitus, dyslipidemia who presents to the ER for evaluation of midsternal chest pain which he has had intermittently for about 4 days. He describes it as chest pressure, intermittent, not related to rest or exertion with radiation to both arms and associated with diaphoresis (cold sweats). He saw his primary care provider 2 days prior to this admission and had some blood work done.  He called his primary care provider on the day of admission to discuss his results and also told him he continued to have pain and was advised to come to the ER for further evaluation. He denies having any nausea, no vomiting, no palpitations, no headache, no fever, no chills, no headache, no blurred vision no focal deficit. Chart review shows patient has a hemoglobin A1c of 6.6 from 05/02 and hypertriglyceridemia of 260. Labs reviewed and his serum glucose is 288, initial troponin is elevated at 298. Twelve-lead EKG reviewed by me shows sinus rhythm with left axis deviation.  ST depression in the lateral leads He will be admitted to the hospital for further evaluation.   Review of Systems: As mentioned in the history of present illness. All other systems reviewed and are negative. Past Medical History:  Diagnosis Date   Actinic keratosis    Basal cell carcinoma 2005   L alar crease   Tobacco abuse 01/27/2015   No past surgical history on file. Social History:  has no history on file for tobacco use, alcohol use, and drug use.  Allergies  Allergen Reactions   Ace Inhibitors Cough   Hydrocodone-Acetaminophen  Itching   Oxycodone-Acetaminophen Other (See Comments)    shaking    No family history on file.  Prior to Admission medications   Medication Sig Start Date End Date Taking? Authorizing Provider  acetaminophen (TYLENOL) 500 MG tablet Take 500 mg by mouth every 6 (six) hours as needed for mild pain, moderate pain, fever or headache.   Yes [provider]  losartan-hydrochlorothiazide (HYZAAR) 100-12.5 MG tablet Take 1 tablet by mouth daily. 06/14/21 06/14/22 Yes [provider]  metoprolol succinate (TOPROL-XL) 100 MG 24 hr tablet Take 1 tablet by mouth 2 (two) times daily. 06/14/21  Yes [provider]  losartan (COZAAR) 100 MG tablet Take 100 mg by mouth once. Patient not taking: Reported on 03/10/2022 05/29/19   [provider]  metoprolol succinate (TOPROL-XL) 100 MG 24 hr tablet Take 200 mg by mouth once. Patient not taking: Reported on 03/10/2022 06/13/19   [provider]  tacrolimus (PROTOPIC) 0.1 % ointment Apply topically in the morning and at bedtime. Patient not taking: Reported on 03/10/2022 10/27/20   Brendolyn Patty, MD  valACYclovir (VALTREX) 1000 MG tablet Use as directed. Take 2 tablets by mouth at first onset of symptoms and 2 tablets by mouth 12 hours later. 11/02/21   Brendolyn Patty, MD    Physical Exam: Vitals:   03/10/22 1045 03/10/22 1100 03/10/22 1115 03/10/22 1130  BP:    (!) 142/71  Pulse: 69 64 69 64  Resp: '15 16 14 13  '$ Temp:      TempSrc:  SpO2: 99% 100% 96% 98%  Weight:      Height:       Physical Exam Vitals and nursing note reviewed.  Constitutional:      Appearance: He is well-developed.  HENT:     Head: Normocephalic and atraumatic.  Eyes:     Pupils: Pupils are equal, round, and reactive to light.  Cardiovascular:     Rate and Rhythm: Normal rate and regular rhythm.     Heart sounds: Normal heart sounds.  Pulmonary:     Effort: Pulmonary effort is normal.  Abdominal:     General: Bowel sounds are normal.      Palpations: Abdomen is soft.  Musculoskeletal:        General: Normal range of motion.     Cervical back: Normal range of motion and neck supple.  Skin:    General: Skin is warm and dry.  Neurological:     General: No focal deficit present.     Mental Status: He is alert.  Psychiatric:        Mood and Affect: Mood normal.        Behavior: Behavior normal.     Data Reviewed: Relevant notes from primary care and specialist visits, past discharge summaries as available in EHR, including Care Everywhere. Prior diagnostic testing as pertinent to current admission diagnoses Updated medications and problem lists for reconciliation ED course, including vitals, labs, imaging, treatment and response to treatment Triage notes, nursing and pharmacy notes and ED provider's notes Notable results as noted in HPI Labs reviewed.  134, potassium 3.6, chloride 101, bicarb 22, glucose 88, BUN 23, creatinine 1.30, calcium 9.7, troponin 78, magnesium 2.2, TSH 0.95, total protein 8.1, albumin 4.4, AST 33, ALT 23, alkaline phosphatase 95, total bilirubin 0.9, white count 8.3, hemoglobin 16.6, hematocrit 49.3, RDW 12.4 Chest x-ray reviewed by me shows no evidence of acute cardiopulmonary disease Twelve-lead EKG reviewed by me shows sinus rhythm, left axis deviation, ST depression in the lateral leads. There are no new results to review at this time.  Assessment and Plan: * NSTEMI (non-ST elevated myocardial infarction) [facility]) Patient presents for evaluation of 4 days of intermittent midsternal chest pain with radiation to both arms associated with diaphoresis. Patient's risk factors include diabetes mellitus and dyslipidemia Initial troponin is elevated at 288 We will cycle cardiac enzymes Continue heparin drip initiated in the ER Place patient on aspirin, high intensity statin Continue metoprolol Obtain 2D echocardiogram to assess LVEF and rule out regional wall motion abnormality Consult  cardiology  Diabetes mellitus ([place]) Chart review shows persistently elevated hemoglobin A1c levels between 6 and 6.8 with average blood glucose of 143. Patient would likely need to be started on an oral hypoglycemic agent upon discharge We will place patient on consistent carbohydrate diet  Check blood sugars with meals  Dyslipidemia associated with type 2 diabetes mellitus ([place]) Patient noted to have hypertriglyceridemia (Triglyceride 260 from 12/07/21) We will start patient on atorvastatin   Essential hypertension Continue metoprolol, losartan and HCTZ      Advance Care Planning:   Code Status: Full Code   Consults: Cardiology  Family Communication: Greater than 50% of time was spent discussing patient's condition and plan of care with him and his wife at the bedside.  All questions and concerns have been addressed.  They verbalized understanding and agree with the plan.  Severity of Illness: The appropriate patient status for this patient is INPATIENT. Inpatient status is judged to be reasonable and necessary in  order to provide the required intensity of service to ensure the patient's safety. The patient's presenting symptoms, physical exam findings, and initial radiographic and laboratory data in the context of their chronic comorbidities is felt to place them at high risk for further clinical deterioration. Furthermore, it is not anticipated that the patient will be medically stable for discharge from the hospital within 2 midnights of admission.   * I certify that at the point of admission it is my clinical judgment that the patient will require inpatient hospital care spanning beyond 2 midnights from the point of admission due to high intensity of service, high risk for further deterioration and high frequency of surveillance required.*  Author: Collier Bullock, MD 03/10/2022 12:38 PM  For on call review [email or web address].

## 2022-03-10 NOTE — Progress Notes (Signed)
*  PRELIMINARY RESULTS* Echocardiogram 2D Echocardiogram has been performed.  Jeremy Gilbert 03/10/2022, 3:28 PM

## 2022-03-10 NOTE — Assessment & Plan Note (Signed)
Chart review shows persistently elevated hemoglobin A1c levels between 6 and 6.8 with average blood glucose of 143. Patient would likely need to be started on an oral hypoglycemic agent upon discharge We will place patient on consistent carbohydrate diet  Check blood sugars with meals

## 2022-03-10 NOTE — Plan of Care (Signed)

## 2022-03-10 NOTE — ED Notes (Signed)
Date and time results received: 03/10/22 1110 (use smartphrase ".now" to insert current time)  Test: trop  Critical Value: 298  Name of Provider Notified: Dr. Tamala Julian  Orders Received? Or Actions Taken?: see orders.

## 2022-03-10 NOTE — Consult Note (Signed)
ANTICOAGULATION CONSULT NOTE - Initial Consult  Pharmacy Consult for Heparin infusion Indication: chest pain/ACS  Allergies  Allergen Reactions   Ace Inhibitors Cough   Hydrocodone-Acetaminophen Itching   Oxycodone-Acetaminophen Other (See Comments)    shaking    Patient Measurements: Height: '5\' 7"'$  (170.2 cm) Weight: 72.6 kg (160 lb 0.9 oz) IBW/kg (Calculated) : 66.1 Heparin Dosing Weight: 72.6 kg  Vital Signs: Temp: 97.8 F (36.6 C) (08/03 1009) Temp Source: Oral (08/03 1009) BP: 164/78 (08/03 1009) Pulse Rate: 73 (08/03 1009)  Labs: Recent Labs    03/10/22 1010  HGB 16.6  HCT 49.3  PLT 249  APTT 28  LABPROT 13.2  INR 1.0  CREATININE 1.30*  TROPONINIHS 298*   Estimated Creatinine Clearance: 48 mL/min (A) (by C-G formula based on SCr of 1.3 mg/dL (H)).  Medical History: Past Medical History:  Diagnosis Date   Actinic keratosis    Basal cell carcinoma 2005   L alar crease   Tobacco abuse 01/27/2015    Medications:  NO DOAC prior to admission.  Assessment: HTN, DM (patient states he is prediabetic although A1c from 5/2 is 6.6), and hypertriglyceridemia. Noted LDL 85, TG 260. Pharmacy consulted to manage heparin infusion for ACS/NSTEMI.  Goal of Therapy:  Heparin level 0.3-0.7 units/ml Monitor platelets by anticoagulation protocol: Yes   Plan:  Give 4000 units bolus x 1 Start heparin infusion at 800 units/hr Check anti-Xa level in 8 hours and daily while on heparin Continue to monitor H&H and platelets  Sallee Hogrefe Rodriguez-Guzman PharmD, BCPS 03/10/2022 11:30 AM

## 2022-03-11 ENCOUNTER — Encounter
Admission: EM | Disposition: A | Discharge status: Other Acute Inpatient Hospital | Payer: Self-pay | Source: Home / Self Care | Attending: Internal Medicine

## 2022-03-11 ENCOUNTER — Encounter (HOSPITAL_COMMUNITY): Payer: Self-pay

## 2022-03-11 ENCOUNTER — Inpatient Hospital Stay (HOSPITAL_COMMUNITY)
Admission: RE | Admit: 2022-03-11 | Discharge: 2022-03-15 | DRG: 247 | Disposition: A | Discharge status: Home / Self Care | Payer: Medicare Other | Attending: Cardiology | Admitting: Cardiology

## 2022-03-11 DIAGNOSIS — Z885 Allergy status to narcotic agent status: Secondary | ICD-10-CM

## 2022-03-11 DIAGNOSIS — Z7982 Long term (current) use of aspirin: Secondary | ICD-10-CM | POA: Diagnosis not present

## 2022-03-11 DIAGNOSIS — I441 Atrioventricular block, second degree: Secondary | ICD-10-CM | POA: Diagnosis present

## 2022-03-11 DIAGNOSIS — I10 Essential (primary) hypertension: Secondary | ICD-10-CM | POA: Diagnosis present

## 2022-03-11 DIAGNOSIS — I472 Ventricular tachycardia, unspecified: Secondary | ICD-10-CM | POA: Diagnosis not present

## 2022-03-11 DIAGNOSIS — I25110 Atherosclerotic heart disease of native coronary artery with unstable angina pectoris: Secondary | ICD-10-CM | POA: Diagnosis present

## 2022-03-11 DIAGNOSIS — I214 Non-ST elevation (NSTEMI) myocardial infarction: Principal | ICD-10-CM | POA: Diagnosis present

## 2022-03-11 DIAGNOSIS — Z888 Allergy status to other drugs, medicaments and biological substances status: Secondary | ICD-10-CM | POA: Diagnosis not present

## 2022-03-11 DIAGNOSIS — Z87891 Personal history of nicotine dependence: Secondary | ICD-10-CM

## 2022-03-11 DIAGNOSIS — E876 Hypokalemia: Secondary | ICD-10-CM | POA: Diagnosis not present

## 2022-03-11 DIAGNOSIS — E1169 Type 2 diabetes mellitus with other specified complication: Secondary | ICD-10-CM | POA: Diagnosis present

## 2022-03-11 DIAGNOSIS — Z85828 Personal history of other malignant neoplasm of skin: Secondary | ICD-10-CM | POA: Diagnosis not present

## 2022-03-11 DIAGNOSIS — R739 Hyperglycemia, unspecified: Secondary | ICD-10-CM | POA: Diagnosis not present

## 2022-03-11 DIAGNOSIS — E785 Hyperlipidemia, unspecified: Secondary | ICD-10-CM | POA: Diagnosis present

## 2022-03-11 DIAGNOSIS — Z79899 Other long term (current) drug therapy: Secondary | ICD-10-CM | POA: Diagnosis not present

## 2022-03-11 DIAGNOSIS — I200 Unstable angina: Secondary | ICD-10-CM | POA: Diagnosis present

## 2022-03-11 DIAGNOSIS — Z955 Presence of coronary angioplasty implant and graft: Secondary | ICD-10-CM

## 2022-03-11 DIAGNOSIS — E119 Type 2 diabetes mellitus without complications: Secondary | ICD-10-CM

## 2022-03-11 HISTORY — PX: LEFT HEART CATH AND CORONARY ANGIOGRAPHY: CATH118249

## 2022-03-11 HISTORY — PX: CORONARY STENT INTERVENTION: CATH118234

## 2022-03-11 LAB — LIPID PANEL
Cholesterol: 109 mg/dL (ref 0–200)
HDL: 28 mg/dL — ABNORMAL LOW (ref >40)
LDL Cholesterol: 46 mg/dL (ref 0–99)
Total CHOL/HDL Ratio: 3.9 RATIO
Triglycerides: 173 mg/dL — ABNORMAL HIGH (ref <150)
VLDL: 35 mg/dL (ref 0–40)

## 2022-03-11 LAB — POCT ACTIVATED CLOTTING TIME
Activated Clotting Time: 504 seconds
Activated Clotting Time: 293 seconds
Activated Clotting Time: 323 seconds
Activated Clotting Time: 257 seconds

## 2022-03-11 LAB — CBC
HCT: 45.6 % (ref 39.0–52.0)
Hemoglobin: 15.7 g/dL (ref 13.0–17.0)
MCH: 31.5 pg (ref 26.0–34.0)
MCHC: 34.4 g/dL (ref 30.0–36.0)
MCV: 91.6 fL (ref 80.0–100.0)
Platelets: 207 10*3/uL (ref 150–400)
RBC: 4.98 MIL/uL (ref 4.22–5.81)
RDW: 12.3 % (ref 11.5–15.5)
WBC: 11 10*3/uL — ABNORMAL HIGH (ref 4.0–10.5)
nRBC: 0 % (ref 0.0–0.2)

## 2022-03-11 LAB — BASIC METABOLIC PANEL
Anion gap: 8 (ref 5–15)
BUN: 22 mg/dL (ref 8–23)
CO2: 24 mmol/L (ref 22–32)
Calcium: 8.8 mg/dL — ABNORMAL LOW (ref 8.9–10.3)
Chloride: 107 mmol/L (ref 98–111)
Creatinine, Ser: 1.09 mg/dL (ref 0.61–1.24)
GFR, Estimated: >60 mL/min (ref >60)
Glucose, Bld: 119 mg/dL — ABNORMAL HIGH (ref 70–99)
Potassium: 3.4 mmol/L — ABNORMAL LOW (ref 3.5–5.1)
Sodium: 139 mmol/L (ref 135–145)

## 2022-03-11 LAB — GLUCOSE, CAPILLARY
Glucose-Capillary: 134 mg/dL — ABNORMAL HIGH (ref 70–99)
Glucose-Capillary: 133 mg/dL — ABNORMAL HIGH (ref 70–99)
Glucose-Capillary: 120 mg/dL — ABNORMAL HIGH (ref 70–99)

## 2022-03-11 LAB — TROPONIN I (HIGH SENSITIVITY): Troponin I (High Sensitivity): 827 ng/L (ref <18)

## 2022-03-11 LAB — HEPARIN LEVEL (UNFRACTIONATED): Heparin Unfractionated: 0.27 IU/mL — ABNORMAL LOW (ref 0.30–0.70)

## 2022-03-11 SURGERY — LEFT HEART CATH AND CORONARY ANGIOGRAPHY
Anesthesia: Moderate Sedation

## 2022-03-11 MED ORDER — ALPRAZOLAM 0.5 MG PO TABS
1.0000 mg | ORAL_TABLET | Freq: Two times a day (BID) | ORAL | Status: DC | PRN
Start: 2022-03-11 — End: 2022-03-15

## 2022-03-11 MED ORDER — HEPARIN (PORCINE) IN NACL 1000-0.9 UT/500ML-% IV SOLN
INTRAVENOUS | Status: DC | PRN
  Administered 2022-03-11 (×3): 500 mL

## 2022-03-11 MED ORDER — LIDOCAINE HCL 1 % IJ SOLN
INTRAMUSCULAR | Status: AC
  Filled 2022-03-11: qty 20

## 2022-03-11 MED ORDER — LABETALOL HCL 5 MG/ML IV SOLN: 10.0000 mg | INTRAVENOUS | Status: AC | PRN

## 2022-03-11 MED ORDER — HEPARIN BOLUS VIA INFUSION
1100.0000 [IU] | Freq: Once | INTRAVENOUS | Status: AC
  Administered 2022-03-11: 1100 [IU] via INTRAVENOUS
  Filled 2022-03-11: qty 1100

## 2022-03-11 MED ORDER — HYDROCHLOROTHIAZIDE 12.5 MG PO TABS: 12.5000 mg | ORAL_TABLET | Freq: Every day | ORAL | 0 refills | Status: DC

## 2022-03-11 MED ORDER — ASPIRIN 81 MG PO CHEW: 81.0000 mg | CHEWABLE_TABLET | ORAL | 0 refills | Status: AC

## 2022-03-11 MED ORDER — MIDAZOLAM HCL 2 MG/2ML IJ SOLN
INTRAMUSCULAR | Status: AC
  Filled 2022-03-11: qty 2

## 2022-03-11 MED ORDER — ONDANSETRON HCL 4 MG/2ML IJ SOLN
4.0000 mg | Freq: Four times a day (QID) | INTRAMUSCULAR | Status: DC | PRN
Start: 2022-03-11 — End: 2022-03-14

## 2022-03-11 MED ORDER — ASPIRIN 81 MG PO CHEW: 81.0000 mg | CHEWABLE_TABLET | ORAL | Status: DC

## 2022-03-11 MED ORDER — ATORVASTATIN CALCIUM 40 MG PO TABS: 40.0000 mg | ORAL_TABLET | Freq: Every day | ORAL | 0 refills | Status: DC

## 2022-03-11 MED ORDER — TICAGRELOR 90 MG PO TABS
ORAL_TABLET | ORAL | Status: DC | PRN
  Administered 2022-03-11: 180 mg via ORAL

## 2022-03-11 MED ORDER — NITROGLYCERIN 0.4 MG SL SUBL
SUBLINGUAL_TABLET | SUBLINGUAL | Status: DC | PRN
  Administered 2022-03-11 (×4): .4 mg via SUBLINGUAL

## 2022-03-11 MED ORDER — FENTANYL CITRATE (PF) 100 MCG/2ML IJ SOLN
INTRAMUSCULAR | Status: AC
  Filled 2022-03-11: qty 2

## 2022-03-11 MED ORDER — NITROGLYCERIN IN D5W 200-5 MCG/ML-% IV SOLN
0.0000 ug/min | INTRAVENOUS | Status: DC
  Administered 2022-03-14: 40 ug/min via INTRAVENOUS
  Filled 2022-03-11: qty 250

## 2022-03-11 MED ORDER — MIDAZOLAM HCL 2 MG/2ML IJ SOLN
INTRAMUSCULAR | Status: DC | PRN
  Administered 2022-03-11 (×4): 1 mg via INTRAVENOUS

## 2022-03-11 MED ORDER — ACETAMINOPHEN 325 MG PO TABS: 650.0000 mg | ORAL_TABLET | ORAL | Status: DC | PRN

## 2022-03-11 MED ORDER — SODIUM CHLORIDE 0.9 % IV SOLN: 250.0000 mL | INTRAVENOUS | Status: DC | PRN

## 2022-03-11 MED ORDER — HEPARIN SODIUM (PORCINE) 1000 UNIT/ML IJ SOLN
INTRAMUSCULAR | Status: DC | PRN
  Administered 2022-03-11: 6000 [IU] via INTRAVENOUS
  Administered 2022-03-11: 4000 [IU] via INTRAVENOUS
  Administered 2022-03-11: 2000 [IU] via INTRAVENOUS

## 2022-03-11 MED ORDER — SODIUM CHLORIDE 0.9% FLUSH
3.0000 mL | INTRAVENOUS | Status: DC | PRN
Start: 2022-03-12 — End: 2022-03-15

## 2022-03-11 MED ORDER — NITROGLYCERIN 0.4 MG SL SUBL
SUBLINGUAL_TABLET | SUBLINGUAL | Status: AC
  Filled 2022-03-11: qty 1

## 2022-03-11 MED ORDER — HEPARIN SODIUM (PORCINE) 1000 UNIT/ML IJ SOLN
INTRAMUSCULAR | Status: AC
  Filled 2022-03-11: qty 10

## 2022-03-11 MED ORDER — LOSARTAN POTASSIUM 100 MG PO TABS: 100.0000 mg | ORAL_TABLET | Freq: Every day | ORAL | 0 refills | Status: DC

## 2022-03-11 MED ORDER — NITROGLYCERIN IN D5W 200-5 MCG/ML-% IV SOLN
INTRAVENOUS | Status: DC | PRN
  Administered 2022-03-11: 20 ug/min via INTRAVENOUS

## 2022-03-11 MED ORDER — HYDRALAZINE HCL 20 MG/ML IJ SOLN: 10.0000 mg | INTRAMUSCULAR | Status: AC | PRN

## 2022-03-11 MED ORDER — NITROGLYCERIN IN D5W 200-5 MCG/ML-% IV SOLN
INTRAVENOUS | Status: AC
  Filled 2022-03-11: qty 250

## 2022-03-11 MED ORDER — SODIUM CHLORIDE 0.9 % WEIGHT BASED INFUSION
1.0000 mL/kg/h | INTRAVENOUS | Status: AC
  Administered 2022-03-11: 1 mL/kg/h via INTRAVENOUS

## 2022-03-11 MED ORDER — ATORVASTATIN CALCIUM 40 MG PO TABS
40.0000 mg | ORAL_TABLET | Freq: Every day | ORAL | Status: DC
  Administered 2022-03-11 – 2022-03-13 (×3): 40 mg via ORAL
  Filled 2022-03-11 (×3): qty 1

## 2022-03-11 MED ORDER — TICAGRELOR 90 MG PO TABS
90.0000 mg | ORAL_TABLET | Freq: Two times a day (BID) | ORAL | Status: DC
  Administered 2022-03-12 – 2022-03-14 (×6): 90 mg via ORAL
  Filled 2022-03-11 (×6): qty 1

## 2022-03-11 MED ORDER — FENTANYL CITRATE (PF) 100 MCG/2ML IJ SOLN
INTRAMUSCULAR | Status: DC | PRN
  Administered 2022-03-11: 25 ug via INTRAVENOUS
  Administered 2022-03-11: 50 ug via INTRAVENOUS
  Administered 2022-03-11 (×2): 25 ug via INTRAVENOUS

## 2022-03-11 MED ORDER — HEPARIN (PORCINE) 25000 UT/250ML-% IV SOLN
1400.0000 [IU]/h | INTRAVENOUS | Status: DC
Start: 2022-03-12 — End: 2022-03-14
  Administered 2022-03-12: 1150 [IU]/h via INTRAVENOUS
  Administered 2022-03-12 – 2022-03-13 (×2): 1400 [IU]/h via INTRAVENOUS
  Filled 2022-03-11 (×3): qty 250

## 2022-03-11 MED ORDER — SODIUM CHLORIDE 0.9 % IV SOLN: INTRAVENOUS | Status: DC

## 2022-03-11 MED ORDER — SODIUM CHLORIDE 0.9% FLUSH
3.0000 mL | Freq: Two times a day (BID) | INTRAVENOUS | Status: DC
  Administered 2022-03-15: 3 mL via INTRAVENOUS

## 2022-03-11 MED ORDER — SODIUM CHLORIDE 0.9% FLUSH: 3.0000 mL | Freq: Two times a day (BID) | INTRAVENOUS | Status: DC

## 2022-03-11 MED ORDER — ASPIRIN 81 MG PO CHEW
81.0000 mg | CHEWABLE_TABLET | Freq: Every day | ORAL | Status: DC
  Administered 2022-03-12 – 2022-03-13 (×2): 81 mg via ORAL
  Filled 2022-03-11 (×2): qty 1

## 2022-03-11 MED ORDER — ATROPINE SULFATE 1 MG/10ML IJ SOSY
PREFILLED_SYRINGE | INTRAMUSCULAR | Status: AC
  Filled 2022-03-11: qty 10

## 2022-03-11 MED ORDER — VERAPAMIL HCL 2.5 MG/ML IV SOLN
INTRAVENOUS | Status: AC
  Filled 2022-03-11: qty 2

## 2022-03-11 MED ORDER — TICAGRELOR 90 MG PO TABS
ORAL_TABLET | ORAL | Status: AC
  Filled 2022-03-11: qty 2

## 2022-03-11 MED ORDER — INSULIN ASPART 100 UNIT/ML IJ SOLN
0.0000 [IU] | Freq: Three times a day (TID) | INTRAMUSCULAR | Status: DC
  Administered 2022-03-12: 2 [IU] via SUBCUTANEOUS
  Administered 2022-03-12 (×2): 3 [IU] via SUBCUTANEOUS
  Administered 2022-03-13 – 2022-03-15 (×4): 2 [IU] via SUBCUTANEOUS

## 2022-03-11 SURGICAL SUPPLY — 35 items
BALLN EUPHORA RX 1.5X10 (BALLOONS) ×2
BALLN MINITREK RX 1.5X12 (BALLOONS) ×2
BALLN MINITREK RX 2.0X12 (BALLOONS) ×2
BALLN TREK RX 2.5X15 (BALLOONS) ×2
BALLOON EUPHORA RX 1.5X10 (BALLOONS) IMPLANT
BALLOON MINITREK RX 1.5X12 (BALLOONS) IMPLANT
BALLOON MINITREK RX 2.0X12 (BALLOONS) IMPLANT
BALLOON TREK RX 2.5X15 (BALLOONS) IMPLANT
BAND CMPR LRG ZPHR (HEMOSTASIS) ×1
BAND ZEPHYR COMPRESS 30 LONG (HEMOSTASIS) ×1 IMPLANT
CATH 5F 110X4 TIG (CATHETERS) ×1 IMPLANT
CATH 5FR JL3.5 JR4 ANG PIG MP (CATHETERS) ×1 IMPLANT
CATH INFINITI 5FR JL4 (CATHETERS) ×1 IMPLANT
CATH VISTA GUIDE 6FR JR4 SH (CATHETERS) ×1 IMPLANT
DEVICE CLOSURE MYNXGRIP 6/7F (Vascular Products) ×1 IMPLANT
DRAPE BRACHIAL (DRAPES) ×1 IMPLANT
GLIDESHEATH SLEND SS 6F .021 (SHEATH) ×1 IMPLANT
GUIDELINER 6F (CATHETERS) ×1 IMPLANT
GUIDEWIRE INQWIRE 1.5J.035X260 (WIRE) IMPLANT
INQWIRE 1.5J .035X260CM (WIRE) ×2
KIT ENCORE 26 ADVANTAGE (KITS) ×1 IMPLANT
NDL PERC 18GX7CM (NEEDLE) IMPLANT
NEEDLE PERC 18GX7CM (NEEDLE) ×2 IMPLANT
PACK CARDIAC CATH (CUSTOM PROCEDURE TRAY) ×2 IMPLANT
PAD ELECT DEFIB RADIOL ZOLL (MISCELLANEOUS) ×1 IMPLANT
PROTECTION STATION PRESSURIZED (MISCELLANEOUS) ×2
SET ATX SIMPLICITY (MISCELLANEOUS) ×1 IMPLANT
SHEATH AVANTI 6FR X 11CM (SHEATH) ×1 IMPLANT
STATION PROTECTION PRESSURIZED (MISCELLANEOUS) IMPLANT
TUBING CIL FLEX 10 FLL-RA (TUBING) ×1 IMPLANT
WIRE ASAHI FIELDER XT 300CM (WIRE) ×1 IMPLANT
WIRE G HI TQ BMW 190 (WIRE) ×3 IMPLANT
WIRE GUIDERIGHT .035X150 (WIRE) ×1 IMPLANT
WIRE HI TORQ WHISPER MS 190CM (WIRE) ×1 IMPLANT
WIRE RUNTHROUGH .014X180CM (WIRE) ×1 IMPLANT

## 2022-03-11 NOTE — Progress Notes (Addendum)
Patients systolic blood pressure in the 90's. Held all morning medications. Decreased nitroglycerin infusion to 10 mcg/hr. Monitoring blood pressure frequently. 1900 Reviewed with Dr. Johney Frame. Titrate nitroglycerin as needed. Orders in for nitroglycerin infusion. Agreed regarding holding po medications. Report to Mick Sell RN.

## 2022-03-11 NOTE — Discharge Summary (Signed)
   Physician Discharge Summary      Patient ID: Jeremy Gilbert MRN: [id number] DOB/AGE: [birth year] 71 y.o.  Admit date: 03/10/2022 Discharge date: 03/11/2022  Primary Discharge Diagnosis unstable angina Secondary Discharge Diagnosis single-vessel coronary disease unstable angina  Significant Diagnostic Studies: Cardiac cath, unsuccessful PCI and stent to mid RCA.  Unable to cross with balloon  Consults: None and    Hospital Course: Patient was originally admitted with unstable anginal type symptoms placed on telemetry and heparin and referred for cardiac catheterization because of symptoms concerning for unstable angina..  Patient found is brought to the cardiac Cath Lab diagnostic cardiac cath showed preserved left ventricular function normal coronaries left system high-grade lesion 99% in the mid right.  Attempt at PCI of the mid right was undertaken by me we were successful in being able to cross with a wire but not with the balloon elicited doctor End help who also was also unable to cross with the balloon at that point we elected to abort the procedure with 2 catheters placed a minx The patient on IV nitro and plan to have heparin restarted in 2 hours we elicited the transfer center to help [place] transfer the patient to [facility] for possible complex high risk intervention at a tertiary care center.  Case discussed with family patient.  Attempted to send a secure chat to primary hospitalist Dr. Billie Ruddy.  Also discussed case with the nurse Danae Chen.  EMTALA form was also completed by me.    Discharge Exam: Blood pressure (!) 148/69, pulse 68, temperature 97.8 F (36.6 C), temperature source Oral, resp. rate 17, height '5\' 7"'$  (1.702 m), weight 73.5 kg, SpO2 99 %.   General appearance: appears stated age Neck: no adenopathy, no carotid bruit, no JVD, supple, symmetrical, trachea midline, and thyroid not enlarged, symmetric, no tenderness/mass/nodules Resp: clear to auscultation bilaterally Cardio:  regular rate and rhythm, S1, S2 normal, no murmur, click, rub or gallop Extremities: extremities normal, atraumatic, no cyanosis or edema Pulses: 2+ and symmetric Neurologic: Alert and oriented X 3, normal strength and tone. Normal symmetric reflexes. Normal coordination and gait Labs:   Lab Results  Component Value Date   WBC 11.0 (H) 03/11/2022   HGB 15.7 03/11/2022   HCT 45.6 03/11/2022   MCV 91.6 03/11/2022   PLT 207 03/11/2022    Recent Labs  Lab 03/10/22 1010 03/11/22 0359  NA 134* 139  K 3.6 3.4*  CL 101 107  CO2 22 24  BUN 23 22  CREATININE 1.30* 1.09  CALCIUM 9.2 8.8*  PROT 8.1  --   BILITOT 0.9  --   ALKPHOS 95  --   ALT 23  --   AST 33  --   GLUCOSE 288* 119*      Radiology:   EKG independently evaluated by me suggest normal sinus rhythm diffuse nonspecific ST-T changes rate of about 70 EKG:   FOLLOW UP PLANS AND APPOINTMENTS     BRING ALL MEDICATIONS WITH YOU TO FOLLOW UP APPOINTMENTS  Time spent with patient to include physician time: 1 hour Signed:  Yolonda Kida MD 03/11/2022, 4:52 PM

## 2022-03-11 NOTE — Consult Note (Addendum)
ANTICOAGULATION CONSULT NOTE  Pharmacy Consult for Heparin infusion Indication: chest pain/ACS  Allergies  Allergen Reactions   Ace Inhibitors Cough   Hydrocodone-Acetaminophen Itching   Oxycodone-Acetaminophen Other (See Comments)    shaking    Patient Measurements:   Heparin Dosing Weight: 73.5 kg  Vital Signs: Temp: 98.1 F (36.7 C) (08/04 2033) Temp Source: Oral (08/04 2033) BP: 110/72 (08/04 2033) Pulse Rate: 88 (08/04 2033)  Labs: Recent Labs    03/10/22 1010 03/10/22 1355 03/10/22 1606 03/10/22 1750 03/11/22 0359 03/11/22 0902  HGB 16.6  --   --   --  15.7  --   HCT 49.3  --   --   --  45.6  --   PLT 249  --   --   --  207  --   APTT 28  --   --   --   --   --   LABPROT 13.2  --   --   --   --   --   INR 1.0  --   --   --   --   --   HEPARINUNFRC  --   --   --  0.19* 0.27*  --   CREATININE 1.30*  --   --   --  1.09  --   TROPONINIHS 298*   < > 919* 1,256*  --  827*   < > = values in this interval not displayed.    Estimated Creatinine Clearance: 57.3 mL/min (by C-G formula based on SCr of 1.09 mg/dL).  Medical History: Past Medical History:  Diagnosis Date   Actinic keratosis    Basal cell carcinoma 2005   L alar crease   Tobacco abuse 01/27/2015    Assessment: 30 yom presenting with NSTEMI to [facility] and initiated on heparin. PCI attempted but unsuccessful at [facility], now transferred to [facility] for consideration for further intervention. Pharmacy consulted to resume heparin 2 hours post-TR band removal. TR band removed at 2203 per discussion with RN. CBC wnl. No bleed issues reported. Patient is not on anticoagulation PTA. Noted also on DAPT with Brilinta and aspirin.  Heparin rate increased to 1150 units/hr at [facility] prior to d/c for cath (heparin level 0.27 on 1000 units/hr).  Goal of Therapy:  Heparin level 0.3-0.7 units/ml Monitor platelets by anticoagulation protocol: Yes   Plan:  Resume heparin at previous rate 1150 units/hr at 0000 (2 hrs post-TR  band) Check 8hr heparin level from resumption Monitor daily CBC, s/sx bleeding F/u further Cardiology plans   Arturo Morton, PharmD, BCPS Please check AMION for all [place] contact numbers Clinical Pharmacist 03/11/2022 10:09 PM

## 2022-03-11 NOTE — Plan of Care (Signed)
  Problem: Education: Goal: Knowledge of General Education information will improve Description: Including pain rating scale, medication(s)/side effects and non-pharmacologic comfort measures Outcome: Progressing   Problem: Activity: Goal: Risk for activity intolerance will decrease Outcome: Progressing   

## 2022-03-11 NOTE — H&P (Signed)
Cardiology Admission History and Physical:   Patient ID: Jeremy Gilbert MRN: [id number]; DOB: Aug 06, 1951   Admission date: 03/11/2022  PCP:  Juluis Pitch, MD   [facility] Providers Cardiologist:  None    Chief Complaint:  NSTEMI  Patient Profile:   Jeremy Gilbert is a 71 y.o. male with HTN, DMII, and prior tobacco use who is being seen 03/11/2022 for the evaluation of NSTEMI with difficulty crossing RCA lesion now transferred to [facility] for further evaluation.  History of Present Illness:   Jeremy Gilbert is a 71 year old male with history as detailed above who presented to [facility] with several day history of chest pain. Patient states that he was feeling well until this past Sunday when he developed significant central chest pain/burning. His pain was intermittent and usually worsened when his blood pressure would elevated. He saw his PCP on Tuesday for his symptoms and labs and ECG were checked but he was unsure of the results. His symptoms worsened significantly on Wednesday and he finally decided to go to the ER on Thursday.  At [place], [place]. ECG with TWI inferior leads and T-wave flattening in lateral leads. TTE with LVEF 50-55%, G1DD, trivial MR. He was taken to the cath lab on 03/11/22 due to persistent angina found to have high grade 99% mid-RCA. PCI was attempted but the lesion was unable to be crossed with a balloon (wire did cross). Dr. Saunders Revel was called to the cath lab as well and was also unable to cross the lesion with a balloon. The procedure was aborted. The patient is now transferred to [facility] for consideration for further intervention.   Currently, the patient is comfortable and chest pain free. BP soft on nitro gtt. TR band still in place with palpable radial pulse.    Past Medical History:  Diagnosis Date   Actinic keratosis    Basal cell carcinoma 2005   L alar crease   Tobacco abuse 01/27/2015    Past Surgical History:  Procedure Laterality Date   OTHER  SURGICAL HISTORY Right 04/08/1994   Right external fixation of tibia and fibia     Medications Prior to Admission: Prior to Admission medications   Medication Sig Start Date End Date Taking? Authorizing Provider  acetaminophen (TYLENOL) 500 MG tablet Take 500 mg by mouth every 6 (six) hours as needed for mild pain, moderate pain, fever or headache.   Yes [provider]  aspirin 81 MG chewable tablet Chew 1 tablet (81 mg total) by mouth before cath procedure. 03/12/22 04/11/22 Yes Callwood, Dwayne D, MD  atorvastatin (LIPITOR) 40 MG tablet Take 1 tablet (40 mg total) by mouth daily. 03/11/22  Yes Callwood, Dwayne D, MD  losartan-hydrochlorothiazide (HYZAAR) 50-12.5 MG tablet Take 1 tablet by mouth daily.   Yes [provider]  metoprolol succinate (TOPROL-XL) 100 MG 24 hr tablet Take 1 tablet by mouth 2 (two) times daily. 06/14/21  Yes [provider]  hydrochlorothiazide (HYDRODIURIL) 12.5 MG tablet Take 1 tablet (12.5 mg total) by mouth daily. Patient not taking: Reported on 03/11/2022 03/11/22   Lujean Amel D, MD  losartan (COZAAR) 100 MG tablet Take 1 tablet (100 mg total) by mouth daily. Patient not taking: Reported on 03/11/2022 03/11/22   Yolonda Kida, MD     Allergies:    Allergies  Allergen Reactions   Ace Inhibitors Cough   Hydrocodone-Acetaminophen Itching   Oxycodone-Acetaminophen Other (See Comments)    shaking    Social History:  Social History   Socioeconomic History   Marital status: Married    Spouse name: [name]   Number of children: 2   Years of education: Not on file   Highest education level: Not on file  Occupational History   Not on file  Tobacco Use   Smoking status: Former    Types: Cigarettes    Start date: 01/07/1968    Quit date: 01/07/2000    Years since quitting: 22.1   Smokeless tobacco: Never  Vaping Use   Vaping Use: Never used  Substance and Sexual Activity   Alcohol use: Yes    Alcohol/week: 18.0 standard drinks of  alcohol    Types: 18 Cans of beer per week   Drug use: Never   Sexual activity: Not Currently  Other Topics Concern   Not on file  Social History Narrative   Not on file   Social Determinants of Health   Financial Resource Strain: Not on file  Food Insecurity: Not on file  Transportation Needs: Not on file  Physical Activity: Not on file  Stress: Not on file  Social Connections: Not on file  Intimate Partner Violence: Not on file    Family History:   The patient's family history is not on file.    ROS:  Please see the history of present illness.  All other ROS reviewed and negative.     Physical Exam/Data:   Vitals:   03/11/22 1751  BP: 93/64  Pulse: 80  Resp: 18  Temp: (!) 97.5 F (36.4 C)  TempSrc: Oral  SpO2: 95%   No intake or output data in the 24 hours ending 03/11/22 1949    03/11/2022   12:25 PM 03/10/2022    6:00 PM 03/10/2022    5:00 PM  Last 3 Weights  Weight (lbs) 162 lb 163 lb 1.8 oz 163 lb 1.6 oz  Weight (kg) 73.483 kg 73.986 kg 73.982 kg     There is no height or weight on file to calculate BMI.  General:  Well nourished, well developed, in no acute distress HEENT: normal Neck: no JVD Vascular: No carotid bruits; Distal pulses 2+ bilaterally   Cardiac:  normal S1, S2; RRR; no murmur  Lungs:  clear to auscultation bilaterally, no wheezing, rhonchi or rales  Abd: soft, nontender, no hepatomegaly  Ext: no edema. Right radial site with TR band in place with palpable radial pulse Musculoskeletal:  No deformities, BUE and BLE strength normal and equal Skin: warm and dry  Neuro:  CNs 2-12 intact, no focal abnormalities noted Psych:  Normal affect    EKG:  The ECG that was done  was personally reviewed and demonstrates NSR with TWI in inferior leads and t-wave flattening in the lateral leads  Relevant CV Studies: TTE 03/11/22: IMPRESSIONS     1. Left ventricular ejection fraction, by estimation, is 50 to 55%. The  left ventricle has low normal  function. The left ventricle has no regional  wall motion abnormalities. Left ventricular diastolic parameters are  consistent with Grade I diastolic  dysfunction (impaired relaxation).   2. Right ventricular systolic function is normal. The right ventricular  size is normal.   3. Left atrial size was mildly dilated.   4. The mitral valve is normal in structure. Trivial mitral valve  regurgitation.   5. The aortic valve is normal in structure. Aortic valve regurgitation is  not visualized.   Cath per discharge summary (full report pending) No significant LAD/Lcx disease 99% mid-RCA  disease  Laboratory Data:  High Sensitivity Troponin:   Recent Labs  Lab 03/10/22 1010 03/10/22 1355 03/10/22 1606 03/10/22 1750 03/11/22 0902  TROPONINIHS 298* 563* 919* 1,256* 827*      Chemistry Recent Labs  Lab 03/10/22 1010 03/11/22 0359  NA 134* 139  K 3.6 3.4*  CL 101 107  CO2 22 24  GLUCOSE 288* 119*  BUN 23 22  CREATININE 1.30* 1.09  CALCIUM 9.2 8.8*  MG 2.2  --   GFRNONAA 58* >60  ANIONGAP 11 8    Recent Labs  Lab 03/10/22 1010  PROT 8.1  ALBUMIN 4.4  AST 33  ALT 23  ALKPHOS 95  BILITOT 0.9   Lipids  Recent Labs  Lab 03/11/22 0359  CHOL 109  TRIG 173*  HDL 28*  LDLCALC 46  CHOLHDL 3.9   Hematology Recent Labs  Lab 03/10/22 1010 03/11/22 0359  WBC 8.3 11.0*  RBC 5.31 4.98  HGB 16.6 15.7  HCT 49.3 45.6  MCV 92.8 91.6  MCH 31.3 31.5  MCHC 33.7 34.4  RDW 12.4 12.3  PLT 249 207   Thyroid  Recent Labs  Lab 03/10/22 1010  TSH 0.951   BNPNo results for input(s): "BNP", "PROBNP" in the last 168 hours.  DDimer No results for input(s): "DDIMER" in the last 168 hours.   Radiology/Studies:  No results found.   Assessment and Plan:   #NSTEMI: Patient presented to [facility] with several day history of chest pain found to have trop 1200 with inferior TWI on ECG. He was taken to the cath lab 03/11/22 where he was found to have 99% mid-RCA stenosis.  Unfortunately, the lesion was unable to be crossed by the balloon despite multiple attempts. The procedure was aborted and the patient is now transferred to [facility] for further intervention. Currently, he is chest pain free and comfortable.  -Plan for LHC with likely atherectomy on Monday -Restart heparin gtt 2h after TR band removal -Continue nitro gtt -Continue ASA '81mg'$  daily -Continue brilinta '90mg'$  BID -Continue lipitor '40mg'$  daily -Hold BB and ARB due to soft blood pressures; resume once post-cath and off nitro  #HTN: BP currently soft on nitro gtt.  -Continue nitro gtt -Once off nitro, can add back BB and ARB  #HLD: -Continue lipitor '40mg'$  daily -Will need repeat lipids as outpatient   Risk Assessment/Risk Scores:    TIMI Risk Score for Unstable Angina or Non-ST Elevation MI:   The patient's TIMI risk score is 7, which indicates a 41% risk of all cause mortality, new or recurrent myocardial infarction or need for urgent revascularization in the next 14 days.{     Severity of Illness: The appropriate patient status for this patient is INPATIENT. Inpatient status is judged to be reasonable and necessary in order to provide the required intensity of service to ensure the patient's safety. The patient's presenting symptoms, physical exam findings, and initial radiographic and laboratory data in the context of their chronic comorbidities is felt to place them at high risk for further clinical deterioration. Furthermore, it is not anticipated that the patient will be medically stable for discharge from the hospital within 2 midnights of admission.   * I certify that at the point of admission it is my clinical judgment that the patient will require inpatient hospital care spanning beyond 2 midnights from the point of admission due to high intensity of service, high risk for further deterioration and high frequency of surveillance required.*   For questions or updates, please  contact [place] Please consult [email or web address] for contact info under     Signed, Freada Bergeron, MD  03/11/2022 7:49 PM

## 2022-03-11 NOTE — Progress Notes (Signed)
[place] Interventional Cardiology Note  I was asked by the Cath Lab staff and Dr. Clayborn Bigness to assist with challenging intervention to RCA.  Please see Dr. Etta Quill note for complete details of the procedure.  PCI was initially attempted by Dr. Clayborn Bigness via radial approach.  However, due to inadequate guide support, he elected to switch to femoral access.  He was unable to cross the lesion into the distal RCA.  With a wire in an acute marginal branch, there was TIMI-1 flow into the distal RCA with resultant ST elevation in inferior leads and severe angina refractory to medical therapy.  I initially attempted to wire into the distal RCA using BMW and Whisper wires but was unsuccessful.  Ultimately, I was able to cross the lesion into the distal RCA with a Fielder XT wire.  However, I was unable to cross the lesion with multiple balloons including 1.5 x 10 mm, 1.5 x 12 mm, and 2.0 x 12 mm (including with use of a guide extension).  Attempts to place a a buddy wire were unsuccessful due to the severity of the mid RCA stenosis.  Given TIMI-2 flow in the RCA and resolution of chest pain, Dr. Clayborn Bigness and I agreed it would be safest to abort the procedure and consider transfer to [facility] for further management.  Jeremy Bush, MD [facility] 03/11/22 7:16 PM

## 2022-03-11 NOTE — Consult Note (Signed)
ANTICOAGULATION CONSULT NOTE - Initial Consult  Pharmacy Consult for Heparin infusion Indication: chest pain/ACS  Allergies  Allergen Reactions   Ace Inhibitors Cough   Hydrocodone-Acetaminophen Itching   Oxycodone-Acetaminophen Other (See Comments)    shaking    Patient Measurements: Height: '5\' 7"'$  (170.2 cm) Weight: 74 kg (163 lb 1.8 oz) IBW/kg (Calculated) : 66.1 Heparin Dosing Weight: 72.6 kg  Vital Signs: Temp: 98.5 F (36.9 C) (08/04 0434) Temp Source: Oral (08/04 0434) BP: 150/75 (08/04 0434) Pulse Rate: 58 (08/04 0434)  Labs: Recent Labs    03/10/22 1010 03/10/22 1355 03/10/22 1606 03/10/22 1750 03/11/22 0359  HGB 16.6  --   --   --  15.7  HCT 49.3  --   --   --  45.6  PLT 249  --   --   --  207  APTT 28  --   --   --   --   LABPROT 13.2  --   --   --   --   INR 1.0  --   --   --   --   HEPARINUNFRC  --   --   --  0.19* 0.27*  CREATININE 1.30*  --   --   --  1.09  TROPONINIHS 298* 563* 919* 1,256*  --     Estimated Creatinine Clearance: 57.3 mL/min (by C-G formula based on SCr of 1.09 mg/dL).  Medical History: Past Medical History:  Diagnosis Date   Actinic keratosis    Basal cell carcinoma 2005   L alar crease   Tobacco abuse 01/27/2015    Medications:  NO DOAC prior to admission.  Assessment: HTN, DM (patient states he is prediabetic although A1c from 5/2 is 6.6), and hypertriglyceridemia. Noted LDL 85, TG 260. Pharmacy consulted to manage heparin infusion for ACS/NSTEMI.  8/3 1750 HL=0.19 Subtherapeutic increase from 800 u/hr to 1000 unit/hr   Goal of Therapy:  Heparin level 0.3-0.7 units/ml Monitor platelets by anticoagulation protocol: Yes   Plan:  Give 4000 units bolus x 1 Start heparin infusion at 800 units/hr Check anti-Xa level in 8 hours and daily while on heparin Continue to monitor H&H and platelets  8/4: HL @ 0359 = 0.27, subtherapeutic  Will order heparin 1100 units IV X bolus and increase drip rate to 1150 units/hr.   Will recheck HL 8 hrs after rate change.   [place] Pharmacist 03/11/2022

## 2022-03-12 ENCOUNTER — Other Ambulatory Visit: Payer: Self-pay

## 2022-03-12 ENCOUNTER — Encounter (HOSPITAL_COMMUNITY): Payer: Self-pay | Admitting: Internal Medicine

## 2022-03-12 DIAGNOSIS — I214 Non-ST elevation (NSTEMI) myocardial infarction: Secondary | ICD-10-CM | POA: Diagnosis not present

## 2022-03-12 LAB — CBC
HCT: 35.2 % — ABNORMAL LOW (ref 39.0–52.0)
Hemoglobin: 12.5 g/dL — ABNORMAL LOW (ref 13.0–17.0)
MCH: 32.6 pg (ref 26.0–34.0)
MCHC: 35.5 g/dL (ref 30.0–36.0)
MCV: 91.7 fL (ref 80.0–100.0)
Platelets: 171 10*3/uL (ref 150–400)
RBC: 3.84 MIL/uL — ABNORMAL LOW (ref 4.22–5.81)
RDW: 12.3 % (ref 11.5–15.5)
WBC: 11.4 10*3/uL — ABNORMAL HIGH (ref 4.0–10.5)
nRBC: 0 % (ref 0.0–0.2)

## 2022-03-12 LAB — GLUCOSE, CAPILLARY
Glucose-Capillary: 181 mg/dL — ABNORMAL HIGH (ref 70–99)
Glucose-Capillary: 142 mg/dL — ABNORMAL HIGH (ref 70–99)
Glucose-Capillary: 153 mg/dL — ABNORMAL HIGH (ref 70–99)
Glucose-Capillary: 123 mg/dL — ABNORMAL HIGH (ref 70–99)

## 2022-03-12 LAB — LIPOPROTEIN A (LPA): Lipoprotein (a): 158.1 nmol/L — ABNORMAL HIGH (ref <75.0)

## 2022-03-12 LAB — HEPARIN LEVEL (UNFRACTIONATED)
Heparin Unfractionated: 0.3 IU/mL (ref 0.30–0.70)
Heparin Unfractionated: 0.28 IU/mL — ABNORMAL LOW (ref 0.30–0.70)

## 2022-03-12 MED ORDER — METOPROLOL TARTRATE 12.5 MG HALF TABLET
12.5000 mg | ORAL_TABLET | Freq: Two times a day (BID) | ORAL | Status: DC
  Administered 2022-03-12 (×2): 12.5 mg via ORAL
  Filled 2022-03-12 (×2): qty 1

## 2022-03-12 NOTE — Consult Note (Signed)
ANTICOAGULATION CONSULT NOTE  Pharmacy Consult for Heparin infusion Indication: chest pain/ACS  Allergies  Allergen Reactions   Ace Inhibitors Cough   Hydrocodone-Acetaminophen Itching   Oxycodone-Acetaminophen Other (See Comments)    shaking    Patient Measurements:   Heparin Dosing Weight: 73.5 kg  Vital Signs: Temp: 98.6 F (37 C) (08/05 1232) Temp Source: Oral (08/05 1232) BP: 118/58 (08/05 1232) Pulse Rate: 77 (08/05 1232)  Labs: Recent Labs    03/10/22 1010 03/10/22 1355 03/10/22 1606 03/10/22 1750 03/10/22 1750 03/11/22 0359 03/11/22 0902 03/12/22 0808 03/12/22 1507  HGB 16.6  --   --   --   --  15.7  --  12.5*  --   HCT 49.3  --   --   --   --  45.6  --  35.2*  --   PLT 249  --   --   --   --  207  --  171  --   APTT 28  --   --   --   --   --   --   --   --   LABPROT 13.2  --   --   --   --   --   --   --   --   INR 1.0  --   --   --   --   --   --   --   --   HEPARINUNFRC  --   --   --  0.19*   < > 0.27*  --  0.30 0.28*  CREATININE 1.30*  --   --   --   --  1.09  --   --   --   TROPONINIHS 298*   < > 919* 1,256*  --   --  827*  --   --    < > = values in this interval not displayed.    Estimated Creatinine Clearance: 57.3 mL/min (by C-G formula based on SCr of 1.09 mg/dL).  Medical History: Past Medical History:  Diagnosis Date   Actinic keratosis    Basal cell carcinoma 2005   L alar crease   Tobacco abuse 01/27/2015    Assessment: 76 yom presenting with NSTEMI to [facility] and initiated on heparin. PCI attempted but unsuccessful at [facility], now transferred to [facility] for consideration for further intervention. Pharmacy consulted to dose heparin.   Heparin level came back subtherapeutic at 0.28 today. We will increase rate then check level in AM.   Goal of Therapy:  Heparin level 0.3-0.7 units/ml Monitor platelets by anticoagulation protocol: Yes   Plan:  Increase heparin gtt to 1400 units/hr Check AM heparin level Monitor daily CBC, s/sx  bleeding F/u cardiology plans to reattempt PCI RCA +/- arthrectomy  Onnie Boer, PharmD, BCIDP, AAHIVP, CPP Infectious Disease Pharmacist 03/12/2022 4:01 PM

## 2022-03-12 NOTE — Consult Note (Addendum)
ANTICOAGULATION CONSULT NOTE  Pharmacy Consult for Heparin infusion Indication: chest pain/ACS  Allergies  Allergen Reactions   Ace Inhibitors Cough   Hydrocodone-Acetaminophen Itching   Oxycodone-Acetaminophen Other (See Comments)    shaking    Patient Measurements:   Heparin Dosing Weight: 73.5 kg  Vital Signs: Temp: 97.7 F (36.5 C) (08/05 0806) Temp Source: Oral (08/05 0806) BP: 116/65 (08/05 0806) Pulse Rate: 82 (08/05 0835)  Labs: Recent Labs    03/10/22 1010 03/10/22 1355 03/10/22 1606 03/10/22 1750 03/11/22 0359 03/11/22 0902 03/12/22 0808  HGB 16.6  --   --   --  15.7  --  12.5*  HCT 49.3  --   --   --  45.6  --  35.2*  PLT 249  --   --   --  207  --  171  APTT 28  --   --   --   --   --   --   LABPROT 13.2  --   --   --   --   --   --   INR 1.0  --   --   --   --   --   --   HEPARINUNFRC  --   --   --  0.19* 0.27*  --  0.30  CREATININE 1.30*  --   --   --  1.09  --   --   TROPONINIHS 298*   < > 919* 1,256*  --  827*  --    < > = values in this interval not displayed.    Estimated Creatinine Clearance: 57.3 mL/min (by C-G formula based on SCr of 1.09 mg/dL).  Medical History: Past Medical History:  Diagnosis Date   Actinic keratosis    Basal cell carcinoma 2005   L alar crease   Tobacco abuse 01/27/2015    Assessment: 3 yom presenting with NSTEMI to [facility] and initiated on heparin. PCI attempted but unsuccessful at [facility], now transferred to [facility] for consideration for further intervention. Pharmacy consulted to dose heparin.   Heparin rate resumed at 1150 units/hr (previous rate while at [facility]). Heparin level therapeutic at 0.3. CBC stable, no signs of bleeding. No issues with infusion noted.   Goal of Therapy:  Heparin level 0.3-0.7 units/ml Monitor platelets by anticoagulation protocol: Yes   Plan:  Increase heparin gtt to 1250 units/hr Check 6hr confirmatory heparin level Monitor daily CBC, s/sx bleeding F/u cardiology plans to reattempt PCI  RCA +/- arthrectomy  Louanne Belton, PharmD, [facility] PGY1 Pharmacy Resident 03/12/2022 9:09 AM

## 2022-03-12 NOTE — Progress Notes (Signed)
Progress Note  Patient Name: Jeremy Gilbert Date of Encounter: 03/12/2022  [facility] Cardiologist: Dr Clayborn Bigness  Subjective   No CP or dyspnea  Inpatient Medications    Scheduled Meds:  aspirin  81 mg Oral Daily   atorvastatin  40 mg Oral Daily   insulin aspart  0-15 Units Subcutaneous TID WC   sodium chloride flush  3 mL Intravenous Q12H   ticagrelor  90 mg Oral BID   Continuous Infusions:  sodium chloride     heparin 1,150 Units/hr (03/12/22 0500)   nitroGLYCERIN 10 mcg/min (03/12/22 0500)   PRN Meds: sodium chloride, acetaminophen, ALPRAZolam, ondansetron (ZOFRAN) IV, sodium chloride flush   Vital Signs    Vitals:   03/11/22 2305 03/11/22 2340 03/12/22 0004 03/12/22 0434  BP: (!) 105/56  (!) 134/115 (!) 107/58  Pulse: 69  81 72  Resp:      Temp:   98.6 F (37 C) 98.3 F (36.8 C)  TempSrc:   Oral Oral  SpO2: 94% 100% 95% 93%    Intake/Output Summary (Last 24 hours) at 03/12/2022 0730 Last data filed at 03/12/2022 0500 Gross per 24 hour  Intake 1097.09 ml  Output 500 ml  Net 597.09 ml      03/11/2022   12:25 PM 03/10/2022    6:00 PM 03/10/2022    5:00 PM  Last 3 Weights  Weight (lbs) 162 lb 163 lb 1.8 oz 163 lb 1.6 oz  Weight (kg) 73.483 kg 73.986 kg 73.982 kg      Telemetry    Sinus with nonsustained ventricular tachycardia- Personally Reviewed   Physical Exam   GEN: No acute distress.   Neck: No JVD Cardiac: RRR, no murmurs, rubs, or gallops.  Respiratory: Clear to auscultation bilaterally. GI: Soft, nontender, non-distended  MS: No edema; right radial and femoral cath site with no hematoma; no femoral bruit Neuro:  Nonfocal  Psych: Normal affect   Labs    High Sensitivity Troponin:   Recent Labs  Lab 03/10/22 1010 03/10/22 1355 03/10/22 1606 03/10/22 1750 03/11/22 0902  TROPONINIHS 298* 563* 919* 1,256* 827*     Chemistry Recent Labs  Lab 03/10/22 1010 03/11/22 0359  NA 134* 139  K 3.6 3.4*  CL 101 107  CO2 22 24   GLUCOSE 288* 119*  BUN 23 22  CREATININE 1.30* 1.09  CALCIUM 9.2 8.8*  MG 2.2  --   PROT 8.1  --   ALBUMIN 4.4  --   AST 33  --   ALT 23  --   ALKPHOS 95  --   BILITOT 0.9  --   GFRNONAA 58* >60  ANIONGAP 11 8    Lipids  Recent Labs  Lab 03/11/22 0359  CHOL 109  TRIG 173*  HDL 28*  LDLCALC 46  CHOLHDL 3.9    Hematology Recent Labs  Lab 03/10/22 1010 03/11/22 0359  WBC 8.3 11.0*  RBC 5.31 4.98  HGB 16.6 15.7  HCT 49.3 45.6  MCV 92.8 91.6  MCH 31.3 31.5  MCHC 33.7 34.4  RDW 12.4 12.3  PLT 249 207   Thyroid  Recent Labs  Lab 03/10/22 1010  TSH 0.951      Radiology    ECHOCARDIOGRAM COMPLETE  Result Date: 03/10/2022    ECHOCARDIOGRAM REPORT   Patient Name:   Jeremy Gilbert Date of Exam: 03/10/2022 Medical Rec #:  [id number]        Height:       67.0 in Accession #:  [id number]       Weight:       160.1 lb Date of Birth:  71 years        BSA:          1.840 m Patient Age:    71 years         BP:           108/61 mmHg Patient Gender: M                HR:           58 bpm. Exam Location:  [facility] Procedure: 2D Echo, Color Doppler and Cardiac Doppler Indications:     I21.4 NSTEMI  History:         Patient has no prior history of Echocardiogram examinations.                  Risk Factors:Current Smoker.  Sonographer:     Charmayne Sheer Referring Phys:  [place] Diagnosing Phys: Yolonda Kida MD IMPRESSIONS  1. Left ventricular ejection fraction, by estimation, is 50 to 55%. The left ventricle has low normal function. The left ventricle has no regional wall motion abnormalities. Left ventricular diastolic parameters are consistent with Grade I diastolic dysfunction (impaired relaxation).  2. Right ventricular systolic function is normal. The right ventricular size is normal.  3. Left atrial size was mildly dilated.  4. The mitral valve is normal in structure. Trivial mitral valve regurgitation.  5. The aortic valve is normal in structure. Aortic valve  regurgitation is not visualized. FINDINGS  Left Ventricle: Left ventricular ejection fraction, by estimation, is 50 to 55%. The left ventricle has low normal function. The left ventricle has no regional wall motion abnormalities. The left ventricular internal cavity size was normal in size. There is borderline left ventricular hypertrophy. Left ventricular diastolic parameters are consistent with Grade I diastolic dysfunction (impaired relaxation). Right Ventricle: The right ventricular size is normal. No increase in right ventricular wall thickness. Right ventricular systolic function is normal. Left Atrium: Left atrial size was mildly dilated. Right Atrium: Right atrial size was normal in size. Pericardium: There is no evidence of pericardial effusion. Mitral Valve: The mitral valve is normal in structure. Trivial mitral valve regurgitation. Tricuspid Valve: The tricuspid valve is normal in structure. Tricuspid valve regurgitation is trivial. Aortic Valve: The aortic valve is normal in structure. Aortic valve regurgitation is not visualized. Aortic valve mean gradient measures 3.0 mmHg. Aortic valve peak gradient measures 5.1 mmHg. Aortic valve area, by VTI measures 2.19 cm. Pulmonic Valve: The pulmonic valve was normal in structure. Pulmonic valve regurgitation is not visualized. Aorta: The ascending aorta was not well visualized. IAS/Shunts: No atrial level shunt detected by color flow Doppler.  LEFT VENTRICLE PLAX 2D LVIDd:         4.57 cm     Diastology LVIDs:         3.74 cm     LV e' medial:    5.98 cm/s LV PW:         1.13 cm     LV E/e' medial:  9.7 LV IVS:        1.02 cm     LV e' lateral:   7.94 cm/s LVOT diam:     1.90 cm     LV E/e' lateral: 7.3 LV SV:         44 LV SV Index:   24 LVOT Area:     2.84 cm  LV Volumes (MOD) LV vol d, MOD A2C: 87.4 ml LV vol d, MOD A4C: 86.3 ml LV vol s, MOD A2C: 39.8 ml LV vol s, MOD A4C: 40.3 ml LV SV MOD A2C:     47.6 ml LV SV MOD A4C:     86.3 ml LV SV MOD BP:       47.9 ml RIGHT VENTRICLE RV Basal diam:  3.08 cm TAPSE (M-mode): 2.6 cm LEFT ATRIUM           Index        RIGHT ATRIUM           Index LA diam:      4.30 cm 2.34 cm/m   RA Area:     13.30 cm LA Vol (A2C): 29.8 ml 16.20 ml/m  RA Volume:   30.40 ml  16.53 ml/m LA Vol (A4C): 39.5 ml 21.47 ml/m  AORTIC VALVE                    PULMONIC VALVE AV Area (Vmax):    2.20 cm     PV Vmax:       0.99 m/s AV Area (Vmean):   1.97 cm     PV Peak grad:  3.9 mmHg AV Area (VTI):     2.19 cm AV Vmax:           113.00 cm/s AV Vmean:          78.900 cm/s AV VTI:            0.201 m AV Peak Grad:      5.1 mmHg AV Mean Grad:      3.0 mmHg LVOT Vmax:         87.50 cm/s LVOT Vmean:        54.900 cm/s LVOT VTI:          0.155 m LVOT/AV VTI ratio: 0.77  AORTA Ao Root diam: 3.40 cm MITRAL VALVE MV Area (PHT): 2.63 cm    SHUNTS MV Decel Time: 288 msec    Systemic VTI:  0.16 m MV E velocity: 57.80 cm/s  Systemic Diam: 1.90 cm MV A velocity: 84.00 cm/s MV E/A ratio:  0.69 Dwayne D Callwood MD Electronically signed by Yolonda Kida MD Signature Date/Time: 03/10/2022/10:35:25 PM    Final    DG Chest 2 View  Result Date: 03/10/2022 CLINICAL DATA:  A 71 year old male presents with chest pain. EXAM: CHEST - 2 VIEW COMPARISON:  CT of the chest from September of 2021. FINDINGS: The heart size and mediastinal contours are within normal limits. Both lungs are clear. The visualized skeletal structures are unremarkable. IMPRESSION: No active cardiopulmonary disease. Electronically Signed   By: Zetta Bills M.D.   On: 03/10/2022 10:38     Patient Profile     71 y.o. male with past medical history of diabetes mellitus, hypertension admitted with non-ST elevation myocardial infarction to [facility].  Attempt at PCI of the right coronary artery was unsuccessful and patient transferred to [facility] for consideration of further intervention.  Assessment & Plan    1 non-ST elevation myocardial infarction-patient remains pain-free.   Continue aspirin, Brilinta, statin, heparin and nitroglycerin.  Add metoprolol 12.5 mg twice daily.  We will review catheterization films from  with interventional colleagues on Monday.  Plan is reattempt at PCI of the right coronary artery.  May need atherectomy.  2 hyperlipidemia-given documented coronary disease will increase Lipitor to 80 mg daily.  Check lipids and  liver in 8 weeks.  3 hypertension-patient's blood pressure is controlled.  We will continue present medications.  4 nonsustained ventricular tachycardia-noted on telemetry.  Add low-dose metoprolol.  Note LV function is normal.  Likely secondary to non-ST elevation microinfarction.  For questions or updates, please contact [place] Please consult [email or web address] for contact info under        Signed, Kirk Ruths, MD  03/12/2022, 7:30 AM

## 2022-03-13 DIAGNOSIS — I200 Unstable angina: Secondary | ICD-10-CM

## 2022-03-13 LAB — BASIC METABOLIC PANEL
Anion gap: 8 (ref 5–15)
BUN: 14 mg/dL (ref 8–23)
CO2: 20 mmol/L — ABNORMAL LOW (ref 22–32)
Calcium: 8.2 mg/dL — ABNORMAL LOW (ref 8.9–10.3)
Chloride: 109 mmol/L (ref 98–111)
Creatinine, Ser: 1.11 mg/dL (ref 0.61–1.24)
GFR, Estimated: >60 mL/min (ref >60)
Glucose, Bld: 108 mg/dL — ABNORMAL HIGH (ref 70–99)
Potassium: 3.3 mmol/L — ABNORMAL LOW (ref 3.5–5.1)
Sodium: 137 mmol/L (ref 135–145)

## 2022-03-13 LAB — GLUCOSE, CAPILLARY
Glucose-Capillary: 136 mg/dL — ABNORMAL HIGH (ref 70–99)
Glucose-Capillary: 116 mg/dL — ABNORMAL HIGH (ref 70–99)
Glucose-Capillary: 122 mg/dL — ABNORMAL HIGH (ref 70–99)
Glucose-Capillary: 106 mg/dL — ABNORMAL HIGH (ref 70–99)
Glucose-Capillary: 104 mg/dL — ABNORMAL HIGH (ref 70–99)

## 2022-03-13 LAB — CBC
HCT: 33.1 % — ABNORMAL LOW (ref 39.0–52.0)
Hemoglobin: 11.7 g/dL — ABNORMAL LOW (ref 13.0–17.0)
MCH: 33.1 pg (ref 26.0–34.0)
MCHC: 35.3 g/dL (ref 30.0–36.0)
MCV: 93.5 fL (ref 80.0–100.0)
Platelets: 148 10*3/uL — ABNORMAL LOW (ref 150–400)
RBC: 3.54 MIL/uL — ABNORMAL LOW (ref 4.22–5.81)
RDW: 12.2 % (ref 11.5–15.5)
WBC: 9.3 10*3/uL (ref 4.0–10.5)
nRBC: 0 % (ref 0.0–0.2)

## 2022-03-13 LAB — HEPARIN LEVEL (UNFRACTIONATED): Heparin Unfractionated: 0.34 IU/mL (ref 0.30–0.70)

## 2022-03-13 MED ORDER — SODIUM CHLORIDE 0.9% FLUSH: 3.0000 mL | INTRAVENOUS | Status: DC | PRN

## 2022-03-13 MED ORDER — ASPIRIN 81 MG PO CHEW
81.0000 mg | CHEWABLE_TABLET | ORAL | Status: AC
  Administered 2022-03-14: 81 mg via ORAL
  Filled 2022-03-13: qty 1

## 2022-03-13 MED ORDER — SODIUM CHLORIDE 0.9 % WEIGHT BASED INFUSION
3.0000 mL/kg/h | INTRAVENOUS | Status: DC
  Administered 2022-03-14: 3 mL/kg/h via INTRAVENOUS

## 2022-03-13 MED ORDER — SODIUM CHLORIDE 0.9 % IV SOLN: 250.0000 mL | INTRAVENOUS | Status: DC | PRN

## 2022-03-13 MED ORDER — SODIUM CHLORIDE 0.9 % WEIGHT BASED INFUSION
1.0000 mL/kg/h | INTRAVENOUS | Status: DC
  Administered 2022-03-14: 1 mL/kg/h via INTRAVENOUS

## 2022-03-13 MED ORDER — SODIUM CHLORIDE 0.9% FLUSH: 3.0000 mL | Freq: Two times a day (BID) | INTRAVENOUS | Status: DC

## 2022-03-13 MED ORDER — POTASSIUM CHLORIDE CRYS ER 20 MEQ PO TBCR
40.0000 meq | EXTENDED_RELEASE_TABLET | Freq: Once | ORAL | Status: AC
Start: 2022-03-13 — End: 2022-03-13
  Administered 2022-03-13: 40 meq via ORAL
  Filled 2022-03-13: qty 2

## 2022-03-13 NOTE — Progress Notes (Addendum)
Notified by CCMD ~ 2 am that pt had 2 beat run of 2nd degree type 2 heart block at 23:06, pt has been sleeping w/ no complaints. FYI secure chat sent to on call [facility] provider, Fransico Him, MD

## 2022-03-13 NOTE — Progress Notes (Signed)
Mobility Specialist Progress Note:   03/13/22 1500  Mobility  Activity Ambulated with assistance in hallway  Level of Assistance Standby assist, set-up cues, supervision of patient - no hands on  Assistive Device None  Distance Ambulated (ft) 500 ft  Activity Response Tolerated well  $Mobility charge 1 Mobility   Pt received in bed willing to participate in mobility. No complaints of pain. Left in bed with call bell in reach and all needs met.   [place] Mobility Specialist  

## 2022-03-13 NOTE — Progress Notes (Signed)
Progress Note  Patient Name: Jeremy Gilbert Date of Encounter: 03/13/2022  [facility] Cardiologist: Dr Clayborn Bigness  Subjective   Pt denies CP or dyspnea  Inpatient Medications    Scheduled Meds:  aspirin  81 mg Oral Daily   atorvastatin  40 mg Oral Daily   insulin aspart  0-15 Units Subcutaneous TID WC   metoprolol tartrate  12.5 mg Oral BID   sodium chloride flush  3 mL Intravenous Q12H   ticagrelor  90 mg Oral BID   Continuous Infusions:  sodium chloride     heparin 1,400 Units/hr (03/13/22 0719)   nitroGLYCERIN 10 mcg/min (03/13/22 0719)   PRN Meds: sodium chloride, acetaminophen, ALPRAZolam, ondansetron (ZOFRAN) IV, sodium chloride flush   Vital Signs    Vitals:   03/12/22 1635 03/12/22 2030 03/13/22 0523 03/13/22 0650  BP: (!) 117/53 (!) 110/58 (!) 108/58   Pulse: 76 77 80   Resp: '16 17 17   '$ Temp: 98.2 F (36.8 C) 98 F (36.7 C) 98.9 F (37.2 C)   TempSrc: Oral Oral Oral   SpO2: 94% 94% 97%   Height:    '5\' 7"'$  (1.702 m)    Intake/Output Summary (Last 24 hours) at 03/13/2022 0743 Last data filed at 03/13/2022 0719 Gross per 24 hour  Intake 840.71 ml  Output 300 ml  Net 540.71 ml       03/11/2022   12:25 PM 03/10/2022    6:00 PM 03/10/2022    5:00 PM  Last 3 Weights  Weight (lbs) 162 lb 163 lb 1.8 oz 163 lb 1.6 oz  Weight (kg) 73.483 kg 73.986 kg 73.982 kg      Telemetry    Sinus with transient 2-1 AV block- Personally Reviewed   Physical Exam   GEN: No acute distress.  WD WN Neck: No JVD, supple Cardiac: RRR Respiratory: CTQ GI: Soft, NT/BND MS: No edema Neuro:  Grossly intact Psych: Normal affect   Labs    High Sensitivity Troponin:   Recent Labs  Lab 03/10/22 1010 03/10/22 1355 03/10/22 1606 03/10/22 1750 03/11/22 0902  TROPONINIHS 298* 563* 919* 1,256* 827*      Chemistry Recent Labs  Lab 03/10/22 1010 03/11/22 0359 03/13/22 0633  NA 134* 139 137  K 3.6 3.4* 3.3*  CL 101 107 109  CO2 22 24 20*  GLUCOSE 288* 119*  108*  BUN '23 22 14  '$ CREATININE 1.30* 1.09 1.11  CALCIUM 9.2 8.8* 8.2*  MG 2.2  --   --   PROT 8.1  --   --   ALBUMIN 4.4  --   --   AST 33  --   --   ALT 23  --   --   ALKPHOS 95  --   --   BILITOT 0.9  --   --   GFRNONAA 58* >60 >60  ANIONGAP '11 8 8     '$ Lipids  Recent Labs  Lab 03/11/22 0359  CHOL 109  TRIG 173*  HDL 28*  LDLCALC 46  CHOLHDL 3.9     Hematology Recent Labs  Lab 03/11/22 0359 03/12/22 0808 03/13/22 0633  WBC 11.0* 11.4* 9.3  RBC 4.98 3.84* 3.54*  HGB 15.7 12.5* 11.7*  HCT 45.6 35.2* 33.1*  MCV 91.6 91.7 93.5  MCH 31.5 32.6 33.1  MCHC 34.4 35.5 35.3  RDW 12.3 12.3 12.2  PLT 207 171 148*    Thyroid  Recent Labs  Lab 03/10/22 1010  TSH 0.951  Radiology    CARDIAC CATHETERIZATION  Result Date: 03/12/2022   Prox RCA lesion is 99% stenosed.   Normal left coronary system   Preserved of left ventricular function EF of at least 55% Conclusion Inpatient cardiac cath for unstable angina Initially right radial approach for cath Which showed high-grade lesion mid RCA 99% Left system with minor irregularities Preserved left ventricular function of at least 55% Intervention Initially intervention was attempted from radial approach Able to cross with a wire but unable to cross with the balloon Switched intervention to right groin approach for more support Once again able to cross with a wire, Dr End but unable to cross with the balloon Intervention was aborted Patient remained hemodynamically stable even after multiple episodes of chest pain and transient ST elevation Patient placed on IV nitroglycerin heparin was to be restarted 2 hours after sheath removal and minx Right radial received a TR band Patient transferred to [facility] for further management     Patient Profile     71 y.o. male with past medical history of diabetes mellitus, hypertension admitted with non-ST elevation myocardial infarction to [facility] [facility].  Attempt at PCI of the right  coronary artery was unsuccessful and patient transferred to [facility] for consideration of further intervention.  Assessment & Plan    1 non-ST elevation myocardial infarction-Continue aspirin, Brilinta, statin, heparin and nitroglycerin.  We will review catheterization films from [place] with interventional colleagues on Monday.  Plan is reattempt at PCI of the right coronary artery.  May need atherectomy. Note pt remains pain free.  2 hyperlipidemia-continue Lipitor at present dose..  Check lipids and liver in 8 weeks.  3 hypertension-patient's blood pressure is controlled.  We will continue present medications.  4 transient 2-1 AV block-occurred while patient asleep.  We will discontinue metoprolol and follow.  5 Hypokalemia-supplement.  For questions or updates, please contact [place] Please consult [email or web address] for contact info under        Signed, Kirk Ruths, MD  03/13/2022, 7:43 AM

## 2022-03-13 NOTE — Consult Note (Signed)
ANTICOAGULATION CONSULT NOTE  Pharmacy Consult for Heparin infusion Indication: chest pain/ACS  Allergies  Allergen Reactions   Ace Inhibitors Cough   Hydrocodone-Acetaminophen Itching   Oxycodone-Acetaminophen Other (See Comments)    shaking    Patient Measurements: Height: '5\' 7"'$  (170.2 cm) IBW/kg (Calculated) : 66.1 Heparin Dosing Weight: 73.5 kg  Vital Signs: Temp: 98.1 F (36.7 C) (08/06 0748) Temp Source: Oral (08/06 0748) BP: 129/61 (08/06 0748) Pulse Rate: 85 (08/06 0748)  Labs: Recent Labs    03/10/22 1010 03/10/22 1010 03/10/22 1355 03/10/22 1606 03/10/22 1750 03/11/22 0359 03/11/22 0902 03/12/22 0808 03/12/22 1507 03/13/22 0633  HGB 16.6  --   --   --   --  15.7  --  12.5*  --  11.7*  HCT 49.3  --   --   --   --  45.6  --  35.2*  --  33.1*  PLT 249  --   --   --   --  207  --  171  --  148*  APTT 28  --   --   --   --   --   --   --   --   --   LABPROT 13.2  --   --   --   --   --   --   --   --   --   INR 1.0  --   --   --   --   --   --   --   --   --   HEPARINUNFRC  --    < >  --   --  0.19* 0.27*  --  0.30 0.28* 0.34  CREATININE 1.30*  --   --   --   --  1.09  --   --   --  1.11  TROPONINIHS 298*  --    < > 919* 1,256*  --  827*  --   --   --    < > = values in this interval not displayed.    Estimated Creatinine Clearance: 56.2 mL/min (by C-G formula based on SCr of 1.11 mg/dL).  Medical History: Past Medical History:  Diagnosis Date   Actinic keratosis    Basal cell carcinoma 2005   L alar crease   Tobacco abuse 01/27/2015    Assessment: 87 yom presenting with NSTEMI to [facility] and initiated on heparin. PCI attempted but unsuccessful at [facility], now transferred to [facility] for consideration for further intervention. Pharmacy consulted to dose heparin.   Heparin level is 0.34 (therapeutic). Per patient, no issues with bleeding. No problems with infusion noted. Hgb 11.7, platelets trending down (148 today), will continue to monitor CBC closely.    Goal of Therapy:  Heparin level 0.3-0.7 units/ml Monitor platelets by anticoagulation protocol: Yes   Plan:  Continue heparin gtt at 1400 units/hr F/u daily CBC - platelets trending down Monitor daily heparin level, s/sx bleeding F/u cardiology plans to reattempt PCI RCA +/- arthrectomy  Louanne Belton, PharmD, [facility] PGY1 Pharmacy Resident 03/13/2022 8:14 AM

## 2022-03-14 ENCOUNTER — Inpatient Hospital Stay (HOSPITAL_COMMUNITY)
Admission: RE | Disposition: A | Discharge status: Home / Self Care | Payer: Self-pay | Source: Home / Self Care | Attending: Cardiology

## 2022-03-14 ENCOUNTER — Encounter: Payer: Self-pay | Admitting: Internal Medicine

## 2022-03-14 ENCOUNTER — Telehealth (HOSPITAL_COMMUNITY): Payer: Self-pay

## 2022-03-14 ENCOUNTER — Other Ambulatory Visit (HOSPITAL_COMMUNITY): Payer: Self-pay

## 2022-03-14 DIAGNOSIS — I200 Unstable angina: Secondary | ICD-10-CM | POA: Diagnosis not present

## 2022-03-14 DIAGNOSIS — I25110 Atherosclerotic heart disease of native coronary artery with unstable angina pectoris: Secondary | ICD-10-CM

## 2022-03-14 HISTORY — PX: LEFT HEART CATH: CATH118248

## 2022-03-14 HISTORY — PX: CENTRAL LINE INSERTION: CATH118232

## 2022-03-14 HISTORY — PX: CORONARY STENT INTERVENTION: CATH118234

## 2022-03-14 HISTORY — PX: INTRAVASCULAR IMAGING/OCT: CATH118326

## 2022-03-14 HISTORY — PX: CORONARY ATHERECTOMY: CATH118238

## 2022-03-14 LAB — BASIC METABOLIC PANEL
Anion gap: 7 (ref 5–15)
BUN: 16 mg/dL (ref 8–23)
CO2: 20 mmol/L — ABNORMAL LOW (ref 22–32)
Calcium: 8.3 mg/dL — ABNORMAL LOW (ref 8.9–10.3)
Chloride: 111 mmol/L (ref 98–111)
Creatinine, Ser: 1.1 mg/dL (ref 0.61–1.24)
GFR, Estimated: >60 mL/min (ref >60)
Glucose, Bld: 99 mg/dL (ref 70–99)
Potassium: 3.6 mmol/L (ref 3.5–5.1)
Sodium: 138 mmol/L (ref 135–145)

## 2022-03-14 LAB — CBC
HCT: 33 % — ABNORMAL LOW (ref 39.0–52.0)
Hemoglobin: 11.6 g/dL — ABNORMAL LOW (ref 13.0–17.0)
MCH: 32.6 pg (ref 26.0–34.0)
MCHC: 35.2 g/dL (ref 30.0–36.0)
MCV: 92.7 fL (ref 80.0–100.0)
Platelets: 161 10*3/uL (ref 150–400)
RBC: 3.56 MIL/uL — ABNORMAL LOW (ref 4.22–5.81)
RDW: 12.3 % (ref 11.5–15.5)
WBC: 7.8 10*3/uL (ref 4.0–10.5)
nRBC: 0 % (ref 0.0–0.2)

## 2022-03-14 LAB — GLUCOSE, CAPILLARY
Glucose-Capillary: 113 mg/dL — ABNORMAL HIGH (ref 70–99)
Glucose-Capillary: 119 mg/dL — ABNORMAL HIGH (ref 70–99)
Glucose-Capillary: 128 mg/dL — ABNORMAL HIGH (ref 70–99)

## 2022-03-14 LAB — POCT ACTIVATED CLOTTING TIME
Activated Clotting Time: 257 seconds
Activated Clotting Time: 305 seconds
Activated Clotting Time: 293 seconds
Activated Clotting Time: 209 seconds
Activated Clotting Time: 257 seconds
Activated Clotting Time: 275 seconds
Activated Clotting Time: 275 seconds
Activated Clotting Time: 185 seconds

## 2022-03-14 LAB — HEPARIN LEVEL (UNFRACTIONATED): Heparin Unfractionated: 0.28 IU/mL — ABNORMAL LOW (ref 0.30–0.70)

## 2022-03-14 SURGERY — CORONARY STENT INTERVENTION
Anesthesia: LOCAL

## 2022-03-14 MED ORDER — TICAGRELOR 90 MG PO TABS
ORAL_TABLET | ORAL | Status: DC | PRN
  Administered 2022-03-14: 90 mg via ORAL

## 2022-03-14 MED ORDER — ASPIRIN 81 MG PO CHEW: 81.0000 mg | CHEWABLE_TABLET | ORAL | Status: DC

## 2022-03-14 MED ORDER — IOHEXOL 350 MG/ML SOLN
INTRAVENOUS | Status: DC | PRN
  Administered 2022-03-14: 180 mL

## 2022-03-14 MED ORDER — SODIUM CHLORIDE 0.9% FLUSH: 3.0000 mL | INTRAVENOUS | Status: DC | PRN

## 2022-03-14 MED ORDER — HEPARIN SODIUM (PORCINE) 1000 UNIT/ML IJ SOLN
INTRAMUSCULAR | Status: DC | PRN
  Administered 2022-03-14: 3000 [IU] via INTRAVENOUS
  Administered 2022-03-14: 6000 [IU] via INTRAVENOUS
  Administered 2022-03-14: 3000 [IU] via INTRAVENOUS
  Administered 2022-03-14: 2000 [IU] via INTRAVENOUS
  Administered 2022-03-14: 4000 [IU] via INTRAVENOUS

## 2022-03-14 MED ORDER — MIDAZOLAM HCL 2 MG/2ML IJ SOLN
INTRAMUSCULAR | Status: DC | PRN
  Administered 2022-03-14 (×10): 1 mg via INTRAVENOUS

## 2022-03-14 MED ORDER — FENTANYL CITRATE (PF) 100 MCG/2ML IJ SOLN
INTRAMUSCULAR | Status: AC
  Filled 2022-03-14: qty 2

## 2022-03-14 MED ORDER — HEPARIN SODIUM (PORCINE) 1000 UNIT/ML IJ SOLN
INTRAMUSCULAR | Status: AC
  Filled 2022-03-14: qty 10

## 2022-03-14 MED ORDER — LABETALOL HCL 5 MG/ML IV SOLN
10.0000 mg | INTRAVENOUS | Status: AC | PRN
  Administered 2022-03-14 (×2): 10 mg via INTRAVENOUS

## 2022-03-14 MED ORDER — HYDRALAZINE HCL 20 MG/ML IJ SOLN
INTRAMUSCULAR | Status: AC
  Filled 2022-03-14: qty 1

## 2022-03-14 MED ORDER — HEPARIN (PORCINE) IN NACL 1000-0.9 UT/500ML-% IV SOLN
INTRAVENOUS | Status: DC | PRN
  Administered 2022-03-14 (×3): 500 mL

## 2022-03-14 MED ORDER — ASPIRIN 81 MG PO CHEW
81.0000 mg | CHEWABLE_TABLET | Freq: Every day | ORAL | Status: DC
  Administered 2022-03-15: 81 mg via ORAL
  Filled 2022-03-14: qty 1

## 2022-03-14 MED ORDER — HYDRALAZINE HCL 20 MG/ML IJ SOLN
10.0000 mg | INTRAMUSCULAR | Status: AC | PRN
  Administered 2022-03-14 (×2): 10 mg via INTRAVENOUS

## 2022-03-14 MED ORDER — ACETAMINOPHEN 325 MG PO TABS: 650.0000 mg | ORAL_TABLET | ORAL | Status: DC | PRN

## 2022-03-14 MED ORDER — SODIUM CHLORIDE 0.9 % IV SOLN
INTRAVENOUS | Status: DC
Start: 2022-03-14 — End: 2022-03-14

## 2022-03-14 MED ORDER — SODIUM CHLORIDE 0.9% FLUSH: 3.0000 mL | Freq: Two times a day (BID) | INTRAVENOUS | Status: DC

## 2022-03-14 MED ORDER — METOPROLOL TARTRATE 5 MG/5ML IV SOLN
INTRAVENOUS | Status: AC
  Filled 2022-03-14: qty 5

## 2022-03-14 MED ORDER — ATORVASTATIN CALCIUM 80 MG PO TABS
80.0000 mg | ORAL_TABLET | Freq: Every day | ORAL | Status: DC
  Administered 2022-03-14: 80 mg via ORAL
  Filled 2022-03-14: qty 1

## 2022-03-14 MED ORDER — SODIUM CHLORIDE 0.9 % IV SOLN: 250.0000 mL | INTRAVENOUS | Status: DC | PRN

## 2022-03-14 MED ORDER — SODIUM CHLORIDE 0.9 % IV SOLN
INTRAVENOUS | Status: DC | PRN
  Administered 2022-03-14: 75 mL/h via INTRAVENOUS
  Administered 2022-03-14: 10 mL/h via INTRAVENOUS

## 2022-03-14 MED ORDER — FENTANYL CITRATE (PF) 100 MCG/2ML IJ SOLN
INTRAMUSCULAR | Status: DC | PRN
  Administered 2022-03-14 (×5): 25 ug via INTRAVENOUS
  Administered 2022-03-14: 50 ug via INTRAVENOUS
  Administered 2022-03-14 (×5): 25 ug via INTRAVENOUS

## 2022-03-14 MED ORDER — HEPARIN (PORCINE) IN NACL 1000-0.9 UT/500ML-% IV SOLN
INTRAVENOUS | Status: AC
  Filled 2022-03-14: qty 1000

## 2022-03-14 MED ORDER — TICAGRELOR 90 MG PO TABS
ORAL_TABLET | ORAL | Status: AC
  Filled 2022-03-14: qty 1

## 2022-03-14 MED ORDER — ATORVASTATIN CALCIUM 80 MG PO TABS: 80.0000 mg | ORAL_TABLET | Freq: Every day | ORAL | Status: DC

## 2022-03-14 MED ORDER — ATORVASTATIN CALCIUM 80 MG PO TABS: 80.0000 mg | ORAL_TABLET | ORAL | Status: DC

## 2022-03-14 MED ORDER — ONDANSETRON HCL 4 MG/2ML IJ SOLN: 4.0000 mg | Freq: Four times a day (QID) | INTRAMUSCULAR | Status: DC | PRN

## 2022-03-14 MED ORDER — MIDAZOLAM HCL 2 MG/2ML IJ SOLN
INTRAMUSCULAR | Status: AC
  Filled 2022-03-14: qty 2

## 2022-03-14 MED ORDER — POTASSIUM CHLORIDE CRYS ER 20 MEQ PO TBCR
40.0000 meq | EXTENDED_RELEASE_TABLET | Freq: Once | ORAL | Status: AC
  Administered 2022-03-14: 40 meq via ORAL
  Filled 2022-03-14: qty 2

## 2022-03-14 MED ORDER — NITROGLYCERIN 1 MG/10 ML FOR IR/CATH LAB
INTRA_ARTERIAL | Status: AC
  Filled 2022-03-14: qty 10

## 2022-03-14 MED ORDER — LIDOCAINE HCL (PF) 1 % IJ SOLN
INTRAMUSCULAR | Status: DC | PRN
  Administered 2022-03-14: 5 mL via INTRADERMAL

## 2022-03-14 MED ORDER — LABETALOL HCL 5 MG/ML IV SOLN
INTRAVENOUS | Status: AC
  Filled 2022-03-14: qty 4

## 2022-03-14 MED ORDER — TICAGRELOR 90 MG PO TABS
90.0000 mg | ORAL_TABLET | Freq: Two times a day (BID) | ORAL | Status: DC
  Administered 2022-03-15: 90 mg via ORAL
  Filled 2022-03-14: qty 1

## 2022-03-14 MED ORDER — IOHEXOL 300 MG/ML  SOLN
INTRAMUSCULAR | Status: AC | PRN
  Administered 2022-03-11: 350 mL

## 2022-03-14 MED ORDER — TICAGRELOR 90 MG PO TABS: 90.0000 mg | ORAL_TABLET | Freq: Two times a day (BID) | ORAL | Status: DC

## 2022-03-14 MED ORDER — LIDOCAINE HCL (PF) 1 % IJ SOLN
INTRAMUSCULAR | Status: AC
  Filled 2022-03-14: qty 30

## 2022-03-14 MED ORDER — METOPROLOL TARTRATE 5 MG/5ML IV SOLN
INTRAVENOUS | Status: DC | PRN
  Administered 2022-03-14: 5 mg via INTRAVENOUS

## 2022-03-14 SURGICAL SUPPLY — 50 items
BALL SAPPHIRE NC24 3.0X12 (BALLOONS) ×3
BALL SAPPHIRE NC24 3.5X12 (BALLOONS) ×3
BALLN SAPPHIRE 1.25X10 (BALLOONS) ×3
BALLN SAPPHIRE 2.0X12 (BALLOONS) ×3
BALLN SAPPHIRE 3.0X12 (BALLOONS) ×3
BALLN TREK OTW 2.5X12 (BALLOONS) ×3
BALLN ~~LOC~~ EUPHORA RX 3.0X20 (BALLOONS) ×3
BALLOON SAPPHIRE 1.25X10 (BALLOONS) IMPLANT
BALLOON SAPPHIRE 2.0X12 (BALLOONS) IMPLANT
BALLOON SAPPHIRE 3.0X12 (BALLOONS) IMPLANT
BALLOON SAPPHIRE NC24 3.0X12 (BALLOONS) IMPLANT
BALLOON SAPPHIRE NC24 3.5X12 (BALLOONS) IMPLANT
BALLOON TREK OTW 2.5X12 (BALLOONS) IMPLANT
BALLOON ~~LOC~~ EUPHORA RX 3.0X20 (BALLOONS) IMPLANT
CATH DRAGONFLY OPSTAR (CATHETERS) ×1 IMPLANT
CATH INFINITI 5FR ANG PIGTAIL (CATHETERS) ×1 IMPLANT
CATH LAUNCHER 6FR AL.75 (CATHETERS) ×1 IMPLANT
CATH LAUNCHER 6FR AL1 (CATHETERS) IMPLANT
CATH TELEPORT (CATHETERS) ×1 IMPLANT
CATH TELESCOPE 6F GEC (CATHETERS) ×1 IMPLANT
CATH VISTA GUIDE 7FR AL 1 (CATHETERS) ×1 IMPLANT
CATHETER LAUNCHER 6FR AL1 (CATHETERS) ×3
CLOSURE PERCLOSE PROSTYLE (VASCULAR PRODUCTS) ×1 IMPLANT
CROWN DIAMONDBACK CLASSIC 1.25 (BURR) ×1 IMPLANT
ELECT DEFIB PAD ADLT CADENCE (PAD) ×1 IMPLANT
KIT ENCORE 26 ADVANTAGE (KITS) ×1 IMPLANT
KIT HEART LEFT (KITS) ×3 IMPLANT
KIT MICROPUNCTURE NIT STIFF (SHEATH) ×1 IMPLANT
LUBRICANT VIPERSLIDE CORONARY (MISCELLANEOUS) ×1 IMPLANT
MAT PREVALON FULL STRYKER (MISCELLANEOUS) ×1 IMPLANT
PACK CARDIAC CATHETERIZATION (CUSTOM PROCEDURE TRAY) ×3 IMPLANT
SHEATH PINNACLE 5F 10CM (SHEATH) ×1 IMPLANT
SHEATH PINNACLE 6F 10CM (SHEATH) ×1 IMPLANT
SHEATH PINNACLE 7F 10CM (SHEATH) ×1 IMPLANT
SHEATH PINNACLE 8F 10CM (SHEATH) ×1 IMPLANT
SHEATH PROBE COVER 6X72 (BAG) ×1 IMPLANT
STENT SYNERGY XD 3.0X16 (Permanent Stent) IMPLANT
STENT SYNERGY XD 3.0X38 (Permanent Stent) IMPLANT
SYNERGY XD 3.0X16 (Permanent Stent) ×3 IMPLANT
SYNERGY XD 3.0X38 (Permanent Stent) ×3 IMPLANT
TRANSDUCER W/STOPCOCK (MISCELLANEOUS) ×3 IMPLANT
TUBING CIL FLEX 10 FLL-RA (TUBING) ×3 IMPLANT
VALVE GUARDIAN II ~~LOC~~ HEMO (MISCELLANEOUS) ×1 IMPLANT
WIRE ASAHI SION 300CM (WIRE) ×1 IMPLANT
WIRE CHOICE GRAPHX PT 300 (WIRE) ×1 IMPLANT
WIRE EMERALD 3MM-J .035X150CM (WIRE) ×1 IMPLANT
WIRE EMERALD 3MM-J .035X260CM (WIRE) ×1 IMPLANT
WIRE HI TORQ WHISPER MS 300CM (WIRE) ×2 IMPLANT
WIRE MICRO SET SILHO 5FR 7 (SHEATH) ×1 IMPLANT
WIRE VIPERWIRE COR FLEX .012 (WIRE) ×1 IMPLANT

## 2022-03-14 NOTE — TOC Benefit Eligibility Note (Signed)
Patient Advocate Encounter  This patient is insured through [facility] Part D   The patient is currently admitted and upon discharge could be taking Brilinta '90MG'$  with a 30 day co-pay of $30.  Clista Bernhardt, CPhT Rx Patient Advocate Specialist Phone: [phone]

## 2022-03-14 NOTE — Telephone Encounter (Signed)
Pharmacy Patient Advocate Encounter  Insurance verification completed.    The patient is insured through [facility] Part D   The patient is currently admitted and ran test claims for the following: Brilinta 90.  Copays and coinsurance results were relayed to Inpatient clinical team.

## 2022-03-14 NOTE — Progress Notes (Addendum)
Site area: rt groin arterial sheath pulled by Liana Crocker, RN Site Prior to Removal:  Level 1 hematoma above insertion site Pressure Applied For: 35 minutes Manual:   yes Patient Status During Pull:  stable Post Pull Site:  Level 1, hematoma partially softened, faint bruising locally Post Pull Instructions Given:  yes Post Pull Pulses Present: rt dp dopplered Dressing Applied:  gauze and tegaderm Bedrest begins @ 1915 Comments:

## 2022-03-14 NOTE — Interval H&P Note (Signed)
History and Physical Interval Note:  03/14/2022 9:36 AM  Jeremy Gilbert  has presented today for surgery, with the diagnosis of CAD.  The various methods of treatment have been discussed with the patient and family. After consideration of risks, benefits and other options for treatment, the patient has consented to  Procedure(s): CORONARY STENT INTERVENTION (N/A) as a surgical intervention.  The patient's history has been reviewed, patient examined, no change in status, stable for surgery.  I have reviewed the patient's chart and labs.  Questions were answered to the patient's satisfaction.    Cath Lab Visit (complete for each Cath Lab visit)  Clinical Evaluation Leading to the Procedure:   ACS: Yes.    Non-ACS:    Anginal Classification: CCS IV  Anti-ischemic medical therapy: Maximal Therapy (2 or more classes of medications)  Non-Invasive Test Results: No non-invasive testing performed  Prior CABG: No previous CABG        [place]

## 2022-03-14 NOTE — H&P (View-Only) (Signed)
Progress Note  Patient Name: Jeremy Gilbert Date of Encounter: 03/14/2022  [facility] Cardiologist: Nelva Bush, MD   Subjective   No chest pain  Inpatient Medications    Scheduled Meds:  aspirin  81 mg Oral Daily   atorvastatin  40 mg Oral Daily   insulin aspart  0-15 Units Subcutaneous TID WC   sodium chloride flush  3 mL Intravenous Q12H   sodium chloride flush  3 mL Intravenous Q12H   ticagrelor  90 mg Oral BID   Continuous Infusions:  sodium chloride     sodium chloride     sodium chloride 1 mL/kg/hr (03/14/22 0619)   heparin 1,400 Units/hr (03/14/22 0416)   nitroGLYCERIN 30 mcg/min (03/14/22 0806)   PRN Meds: sodium chloride, sodium chloride, acetaminophen, ALPRAZolam, ondansetron (ZOFRAN) IV, sodium chloride flush, sodium chloride flush   Vital Signs    Vitals:   03/14/22 0632 03/14/22 0656 03/14/22 0702 03/14/22 0732  BP: 130/61 (!) 125/59 108/61 (!) 116/55  Pulse: 90 87 89 85  Resp:      Temp:      TempSrc:      SpO2: 91% 93% 92% 91%  Weight:      Height:        Intake/Output Summary (Last 24 hours) at 03/14/2022 0834 Last data filed at 03/14/2022 0700 Gross per 24 hour  Intake 827.76 ml  Output 1350 ml  Net -522.24 ml      03/13/2022   10:22 AM 03/11/2022   12:25 PM 03/10/2022    6:00 PM  Last 3 Weights  Weight (lbs) 162 lb 12.8 oz 162 lb 163 lb 1.8 oz  Weight (kg) 73.846 kg 73.483 kg 73.986 kg      Telemetry    Sinus rhythm in the 70s - Personally Reviewed  ECG    No new tracings - Personally Reviewed  Physical Exam   GEN: No acute distress.   Neck: No JVD Cardiac: RRR, no murmurs, rubs, or gallops.  Respiratory: Clear to auscultation bilaterally. GI: Soft, nontender, non-distended  MS: No edema; No deformity. Neuro:  Nonfocal  Psych: Normal affect  Radial site C/D/I  Labs    High Sensitivity Troponin:   Recent Labs  Lab 03/10/22 1010 03/10/22 1355 03/10/22 1606 03/10/22 1750 03/11/22 0902  TROPONINIHS 298* 563*  919* 1,256* 827*     Chemistry Recent Labs  Lab 03/10/22 1010 03/11/22 0359 03/13/22 0633 03/14/22 0313  NA 134* 139 137 138  K 3.6 3.4* 3.3* 3.6  CL 101 107 109 111  CO2 22 24 20* 20*  GLUCOSE 288* 119* 108* 99  BUN '23 22 14 16  '$ CREATININE 1.30* 1.09 1.11 1.10  CALCIUM 9.2 8.8* 8.2* 8.3*  MG 2.2  --   --   --   PROT 8.1  --   --   --   ALBUMIN 4.4  --   --   --   AST 33  --   --   --   ALT 23  --   --   --   ALKPHOS 95  --   --   --   BILITOT 0.9  --   --   --   GFRNONAA 58* >60 >60 >60  ANIONGAP '11 8 8 7    '$ Lipids  Recent Labs  Lab 03/11/22 0359  CHOL 109  TRIG 173*  HDL 28*  LDLCALC 46  CHOLHDL 3.9    Hematology Recent Labs  Lab 03/12/22 [phone] 03/13/22 [phone]  03/14/22 0313  WBC 11.4* 9.3 7.8  RBC 3.84* 3.54* 3.56*  HGB 12.5* 11.7* 11.6*  HCT 35.2* 33.1* 33.0*  MCV 91.7 93.5 92.7  MCH 32.6 33.1 32.6  MCHC 35.5 35.3 35.2  RDW 12.3 12.2 12.3  PLT 171 148* 161   Thyroid  Recent Labs  Lab 03/10/22 1010  TSH 0.951    BNPNo results for input(s): "BNP", "PROBNP" in the last 168 hours.  DDimer No results for input(s): "DDIMER" in the last 168 hours.   Radiology    No results found.  Cardiac Studies   Left heart cath 03/11/22:   Prox RCA lesion is 99% stenosed.   Normal left coronary system   Preserved of left ventricular function EF of at least 55%   Conclusion Inpatient cardiac cath for unstable angina Initially right radial approach for cath Which showed high-grade lesion mid RCA 99% Left system with minor irregularities Preserved left ventricular function of at least 55%   Intervention Initially intervention was attempted from radial approach Able to cross with a wire but unable to cross with the balloon   Switched intervention to right groin approach for more support Once again able to cross with a wire, Dr End but unable to cross with the balloon Intervention was aborted Patient remained hemodynamically stable even after multiple  episodes of chest pain and transient ST elevation Patient placed on IV nitroglycerin heparin was to be restarted 2 hours after sheath removal and minx Right radial received a TR band Patient transferred to [facility] for further management   Echo 03/10/22: 1. Left ventricular ejection fraction, by estimation, is 50 to 55%. The  left ventricle has low normal function. The left ventricle has no regional  wall motion abnormalities. Left ventricular diastolic parameters are  consistent with Grade I diastolic  dysfunction (impaired relaxation).   2. Right ventricular systolic function is normal. The right ventricular  size is normal.   3. Left atrial size was mildly dilated.   4. The mitral valve is normal in structure. Trivial mitral valve  regurgitation.   5. The aortic valve is normal in structure. Aortic valve regurgitation is  not visualized.     Patient Profile     71 y.o. male with past medical history of diabetes mellitus, hypertension admitted with non-ST elevation myocardial infarction to [place] [facility].  Attempt at PCI of the right coronary artery was unsuccessful and patient transferred to [facility] for consideration of further intervention.  Assessment & Plan    NSTEMI CAD  - left heart cath with 99% stenosis in the mid RCA - unable to cross lesion 03/11/22 --> transferred to [facility] - will discuss with interventional team on next steps - continue ASA, brilinta, statin, heparin and nitro gtt   Hyperlipidemia with LDL goal < 70 03/11/2022: Cholesterol 109; HDL 28; LDL Cholesterol 46; Triglycerides 173; VLDL 35 Continue lipitor LP (a) 158   Hypertension No change in therapy   Transient 2:1 AV block - noted during nocturnal hours, do not appreciate on telemetry last evening - will hold BB for now    For questions or updates, please contact [place] Please consult [email or web address] for contact info under        Signed, Ledora Bottcher, PA  03/14/2022, 8:34  AM     Agree with note by Fabian Sharp PA-C  Patient transferred from [facility] with non-STEMI after attempted RCA PCI.  They were unable to cross the mid lesion even with a 1 mm balloon.  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  The patient is on IV heparin and nitroglycerin.  He had 2 episodes of chest pain within the last 24 hours.  He is on DAPT with aspirin and Brilinta.  I do not think sending him home is a viable option.  I reviewed his angiograms with Dr. Ali Lowe.  The original intervention was initially radially and then moved to right common femoral for backup.  His exam is benign.  Unfortunately, I do not think he can be discharged.  Plan will be for attempted reintervention this morning.   Lorretta Harp, M.D., [place], [facility], [place] [address] [place], [place]  [address]  [phone] 03/14/2022 9:04 AM

## 2022-03-14 NOTE — Progress Notes (Addendum)
Progress Note  Patient Name: Jeremy Gilbert Date of Encounter: 03/14/2022  [facility] Cardiologist: Nelva Bush, MD   Subjective   No chest pain  Inpatient Medications    Scheduled Meds:  aspirin  81 mg Oral Daily   atorvastatin  40 mg Oral Daily   insulin aspart  0-15 Units Subcutaneous TID WC   sodium chloride flush  3 mL Intravenous Q12H   sodium chloride flush  3 mL Intravenous Q12H   ticagrelor  90 mg Oral BID   Continuous Infusions:  sodium chloride     sodium chloride     sodium chloride 1 mL/kg/hr (03/14/22 0619)   heparin 1,400 Units/hr (03/14/22 0416)   nitroGLYCERIN 30 mcg/min (03/14/22 0806)   PRN Meds: sodium chloride, sodium chloride, acetaminophen, ALPRAZolam, ondansetron (ZOFRAN) IV, sodium chloride flush, sodium chloride flush   Vital Signs    Vitals:   03/14/22 0632 03/14/22 0656 03/14/22 0702 03/14/22 0732  BP: 130/61 (!) 125/59 108/61 (!) 116/55  Pulse: 90 87 89 85  Resp:      Temp:      TempSrc:      SpO2: 91% 93% 92% 91%  Weight:      Height:        Intake/Output Summary (Last 24 hours) at 03/14/2022 0834 Last data filed at 03/14/2022 0700 Gross per 24 hour  Intake 827.76 ml  Output 1350 ml  Net -522.24 ml      03/13/2022   10:22 AM 03/11/2022   12:25 PM 03/10/2022    6:00 PM  Last 3 Weights  Weight (lbs) 162 lb 12.8 oz 162 lb 163 lb 1.8 oz  Weight (kg) 73.846 kg 73.483 kg 73.986 kg      Telemetry    Sinus rhythm in the 70s - Personally Reviewed  ECG    No new tracings - Personally Reviewed  Physical Exam   GEN: No acute distress.   Neck: No JVD Cardiac: RRR, no murmurs, rubs, or gallops.  Respiratory: Clear to auscultation bilaterally. GI: Soft, nontender, non-distended  MS: No edema; No deformity. Neuro:  Nonfocal  Psych: Normal affect  Radial site C/D/I  Labs    High Sensitivity Troponin:   Recent Labs  Lab 03/10/22 1010 03/10/22 1355 03/10/22 1606 03/10/22 1750 03/11/22 0902  TROPONINIHS 298* 563*  919* 1,256* 827*     Chemistry Recent Labs  Lab 03/10/22 1010 03/11/22 0359 03/13/22 0633 03/14/22 0313  NA 134* 139 137 138  K 3.6 3.4* 3.3* 3.6  CL 101 107 109 111  CO2 22 24 20* 20*  GLUCOSE 288* 119* 108* 99  BUN '23 22 14 16  '$ CREATININE 1.30* 1.09 1.11 1.10  CALCIUM 9.2 8.8* 8.2* 8.3*  MG 2.2  --   --   --   PROT 8.1  --   --   --   ALBUMIN 4.4  --   --   --   AST 33  --   --   --   ALT 23  --   --   --   ALKPHOS 95  --   --   --   BILITOT 0.9  --   --   --   GFRNONAA 58* >60 >60 >60  ANIONGAP '11 8 8 7    '$ Lipids  Recent Labs  Lab 03/11/22 0359  CHOL 109  TRIG 173*  HDL 28*  LDLCALC 46  CHOLHDL 3.9    Hematology Recent Labs  Lab 03/12/22 [phone] 03/13/22 [phone]  03/14/22 0313  WBC 11.4* 9.3 7.8  RBC 3.84* 3.54* 3.56*  HGB 12.5* 11.7* 11.6*  HCT 35.2* 33.1* 33.0*  MCV 91.7 93.5 92.7  MCH 32.6 33.1 32.6  MCHC 35.5 35.3 35.2  RDW 12.3 12.2 12.3  PLT 171 148* 161   Thyroid  Recent Labs  Lab 03/10/22 1010  TSH 0.951    BNPNo results for input(s): "BNP", "PROBNP" in the last 168 hours.  DDimer No results for input(s): "DDIMER" in the last 168 hours.   Radiology    No results found.  Cardiac Studies   Left heart cath 03/11/22:   Prox RCA lesion is 99% stenosed.   Normal left coronary system   Preserved of left ventricular function EF of at least 55%   Conclusion Inpatient cardiac cath for unstable angina Initially right radial approach for cath Which showed high-grade lesion mid RCA 99% Left system with minor irregularities Preserved left ventricular function of at least 55%   Intervention Initially intervention was attempted from radial approach Able to cross with a wire but unable to cross with the balloon   Switched intervention to right groin approach for more support Once again able to cross with a wire, Dr End but unable to cross with the balloon Intervention was aborted Patient remained hemodynamically stable even after multiple  episodes of chest pain and transient ST elevation Patient placed on IV nitroglycerin heparin was to be restarted 2 hours after sheath removal and minx Right radial received a TR band Patient transferred to [facility] for further management   Echo 03/10/22: 1. Left ventricular ejection fraction, by estimation, is 50 to 55%. The  left ventricle has low normal function. The left ventricle has no regional  wall motion abnormalities. Left ventricular diastolic parameters are  consistent with Grade I diastolic  dysfunction (impaired relaxation).   2. Right ventricular systolic function is normal. The right ventricular  size is normal.   3. Left atrial size was mildly dilated.   4. The mitral valve is normal in structure. Trivial mitral valve  regurgitation.   5. The aortic valve is normal in structure. Aortic valve regurgitation is  not visualized.     Patient Profile     71 y.o. male with past medical history of diabetes mellitus, hypertension admitted with non-ST elevation myocardial infarction to [place] [facility].  Attempt at PCI of the right coronary artery was unsuccessful and patient transferred to [facility] for consideration of further intervention.  Assessment & Plan    NSTEMI CAD  - left heart cath with 99% stenosis in the mid RCA - unable to cross lesion 03/11/22 --> transferred to [facility] - will discuss with interventional team on next steps - continue ASA, brilinta, statin, heparin and nitro gtt   Hyperlipidemia with LDL goal < 70 03/11/2022: Cholesterol 109; HDL 28; LDL Cholesterol 46; Triglycerides 173; VLDL 35 Continue lipitor LP (a) 158   Hypertension No change in therapy   Transient 2:1 AV block - noted during nocturnal hours, do not appreciate on telemetry last evening - will hold BB for now    For questions or updates, please contact [place] Please consult [email or web address] for contact info under        Signed, Ledora Bottcher, PA  03/14/2022, 8:34  AM     Agree with note by Fabian Sharp PA-C  Patient transferred from [facility] with non-STEMI after attempted RCA PCI.  They were unable to cross the mid lesion even with a 1 mm balloon.  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  The patient is on IV heparin and nitroglycerin.  He had 2 episodes of chest pain within the last 24 hours.  He is on DAPT with aspirin and Brilinta.  I do not think sending him home is a viable option.  I reviewed his angiograms with Dr. Ali Lowe.  The original intervention was initially radially and then moved to right common femoral for backup.  His exam is benign.  Unfortunately, I do not think he can be discharged.  Plan will be for attempted reintervention this morning.   Lorretta Harp, M.D., [place], [facility], [place] [employer] [address] [place], [place]  [address]  [phone] 03/14/2022 9:04 AM

## 2022-03-14 NOTE — Progress Notes (Signed)
ACT 185. Dr. Ali Lowe made aware; okay to pull sheaths in 20 minutes.

## 2022-03-14 NOTE — Progress Notes (Signed)
Site area: left groin venous sheath Site Prior to Removal:  Level 0 Pressure Applied For: 15 minutes Manual:   yes Patient Status During Pull:  stable Post Pull Site:  Level 0 Post Pull Instructions Given:  yes Post Pull Pulses Present: left dp dopplered Dressing Applied:  gauze and tegaderm Bedrest begins @ 1915 Comments:

## 2022-03-14 NOTE — Progress Notes (Signed)
Pt c/o 4/10 chest & arm pain. Requesting BP checked-stated same pain as when his BP is elevated.   04:19 Last "normal" BP 109/60 (before pt c/o pain)  05:10 BP was 186/89 (Pt w/ pain)  IV nitro increased from 10 mcg to 60 mcg before able to relieve pain & decrease BP

## 2022-03-15 ENCOUNTER — Encounter (HOSPITAL_COMMUNITY): Payer: Self-pay | Admitting: Internal Medicine

## 2022-03-15 ENCOUNTER — Other Ambulatory Visit (HOSPITAL_COMMUNITY): Payer: Self-pay

## 2022-03-15 ENCOUNTER — Telehealth: Payer: Self-pay | Admitting: *Deleted

## 2022-03-15 DIAGNOSIS — I200 Unstable angina: Secondary | ICD-10-CM | POA: Diagnosis not present

## 2022-03-15 LAB — GLUCOSE, CAPILLARY
Glucose-Capillary: 121 mg/dL — ABNORMAL HIGH (ref 70–99)
Glucose-Capillary: 146 mg/dL — ABNORMAL HIGH (ref 70–99)
Glucose-Capillary: 135 mg/dL — ABNORMAL HIGH (ref 70–99)

## 2022-03-15 LAB — BASIC METABOLIC PANEL
Anion gap: 11 (ref 5–15)
BUN: 9 mg/dL (ref 8–23)
CO2: 17 mmol/L — ABNORMAL LOW (ref 22–32)
Calcium: 8.4 mg/dL — ABNORMAL LOW (ref 8.9–10.3)
Chloride: 109 mmol/L (ref 98–111)
Creatinine, Ser: 1.04 mg/dL (ref 0.61–1.24)
GFR, Estimated: >60 mL/min (ref >60)
Glucose, Bld: 114 mg/dL — ABNORMAL HIGH (ref 70–99)
Potassium: 3.4 mmol/L — ABNORMAL LOW (ref 3.5–5.1)
Sodium: 137 mmol/L (ref 135–145)

## 2022-03-15 LAB — CBC
HCT: 34.9 % — ABNORMAL LOW (ref 39.0–52.0)
Hemoglobin: 12.1 g/dL — ABNORMAL LOW (ref 13.0–17.0)
MCH: 32.2 pg (ref 26.0–34.0)
MCHC: 34.7 g/dL (ref 30.0–36.0)
MCV: 92.8 fL (ref 80.0–100.0)
Platelets: 179 10*3/uL (ref 150–400)
RBC: 3.76 MIL/uL — ABNORMAL LOW (ref 4.22–5.81)
RDW: 12.4 % (ref 11.5–15.5)
WBC: 9.3 10*3/uL (ref 4.0–10.5)
nRBC: 0 % (ref 0.0–0.2)

## 2022-03-15 MED ORDER — METOPROLOL SUCCINATE ER 100 MG PO TB24
100.0000 mg | ORAL_TABLET | Freq: Two times a day (BID) | ORAL | Status: DC
  Administered 2022-03-15: 100 mg via ORAL
  Filled 2022-03-15: qty 1

## 2022-03-15 MED ORDER — TICAGRELOR 90 MG PO TABS
90.0000 mg | ORAL_TABLET | Freq: Two times a day (BID) | ORAL | 11 refills | Status: DC
  Filled 2022-03-15 – 2022-04-10 (×2): qty 60, 30d supply, fill #0
  Filled 2022-05-12: qty 60, 30d supply, fill #1

## 2022-03-15 MED ORDER — HYDROCHLOROTHIAZIDE 12.5 MG PO TABS
12.5000 mg | ORAL_TABLET | Freq: Every day | ORAL | Status: DC
  Administered 2022-03-15: 12.5 mg via ORAL
  Filled 2022-03-15: qty 1

## 2022-03-15 MED ORDER — ATORVASTATIN CALCIUM 80 MG PO TABS
80.0000 mg | ORAL_TABLET | Freq: Every day | ORAL | 11 refills | Status: DC
  Filled 2022-03-15: qty 30, 30d supply, fill #0

## 2022-03-15 MED ORDER — POTASSIUM CHLORIDE CRYS ER 20 MEQ PO TBCR
40.0000 meq | EXTENDED_RELEASE_TABLET | Freq: Once | ORAL | Status: AC
  Administered 2022-03-15: 40 meq via ORAL
  Filled 2022-03-15: qty 2

## 2022-03-15 MED ORDER — LOSARTAN POTASSIUM 50 MG PO TABS
50.0000 mg | ORAL_TABLET | Freq: Every day | ORAL | Status: DC
  Administered 2022-03-15: 50 mg via ORAL
  Filled 2022-03-15: qty 1

## 2022-03-15 MED FILL — Nitroglycerin IV Soln 100 MCG/ML in D5W: INTRA_ARTERIAL | Qty: 10 | Status: AC

## 2022-03-15 NOTE — Discharge Summary (Addendum)
Discharge Summary    Patient ID: Jeremy Gilbert MRN: [id number]; DOB: 04/19/1951  Admit date: 03/11/2022 Discharge date: 03/15/2022  PCP:  Jeremy Pitch, MD   [facility] Providers Cardiologist:  Jeremy Bush, MD   Discharge Diagnoses    Principal Problem:   Unstable angina Jeremy Gilbert) Active Problems:   Former tobacco use   NSTEMI (non-ST elevated myocardial infarction) (Jeremy Gilbert)   Dyslipidemia associated with type 2 diabetes mellitus (Jeremy Gilbert)   Essential hypertension   Diabetes mellitus (Jeremy Gilbert)    Diagnostic Studies/Procedures    Left heart cath 03/14/22:   Prox RCA lesion is 99% stenosed.   Ost RCA to Prox RCA lesion is 70% stenosed.   A stent was successfully placed.   A stent was successfully placed.   Post intervention, there is a 0% residual stenosis.   Post intervention, there is a 0% residual stenosis.   LV end diastolic pressure is mildly elevated.   1.  High-grade tandem right coronary artery lesions treated with 2 overlapping regularly stents with orbital atherectomy and OCT guidance.  The minimal stent area was over 6 mm and there were no distal dissections at the conclusion Gilbert the procedure. 2.  LVEDP Gilbert 22 mmHg. 3.  Successful ultrasound-guided left common femoral central line placement.   Recommendation: Dual antiplatelet therapy for at least 1 year.     Echo 03/10/22: 1. Left ventricular ejection fraction, by estimation, is 50 to 55%. The  left ventricle has low normal function. The left ventricle has no Gilbert  wall motion abnormalities. Left ventricular diastolic parameters are  consistent with Grade I diastolic  dysfunction (impaired relaxation).   2. Right ventricular systolic function is normal. The right ventricular  size is normal.   3. Left atrial size was mildly dilated.   4. The mitral valve is normal in structure. Trivial mitral valve  regurgitation.   5. The aortic valve is normal in structure. Aortic valve regurgitation is  not visualized.   _____________   History Gilbert Present Illness     Jeremy Gilbert is a 71 y.o. male with past medical history Gilbert diabetes mellitus, hypertension admitted with non-ST elevation myocardial infarction to Jeremy Gilbert.  Attempt at PCI Gilbert the right coronary artery was unsuccessful and patient transferred to Jeremy Gilbert for consideration Gilbert further intervention.  Jeremy Gilbert is a 71 year old male with history as detailed above who presented to Jeremy Gilbert chest pain. Patient states that he was feeling well until this past Sunday when he developed significant central chest pain/burning. His pain was intermittent and usually worsened when his blood pressure was elevated. He saw his PCP on Tuesday for his symptoms and labs and ECG were checked but he was unsure Gilbert the results. His symptoms worsened significantly on Wednesday and he finally decided to go to the ER on Thursday.   At Jeremy Gilbert, Jeremy Gilbert. ECG with TWI inferior leads and T-wave flattening in lateral leads. TTE with LVEF 50-55%, G1DD, trivial MR. He was taken to the cath lab on 03/11/22 due to persistent angina Gilbert to have high grade 99% mid-RCA. PCI was attempted but the lesion was unable to be crossed with a balloon (wire did cross). Jeremy Gilbert was called to the cath lab as well and was also unable to cross the lesion with a balloon. The procedure was aborted. The patient is now transferred to Jeremy Gilbert for consideration for further intervention.   Hospital Course     Consultants: none  NSTEMI CAD Pt was taken back to the cath lab on 03/14/22. LHC demonstrated tandem lesions in the RCA Gilbert 70% and 99% stenosis treated with overlapping DES x 2, orbital atherectomy and OCT guidance. He tolerated the procedure well and was started on DAPT with ASA and brilinta. Bilateral groin sites C/D/I after ambulation with cardiac rehab.    Hyperlipidemia with LDL goal < 70 03/11/2022: Cholesterol 109; HDL 28; LDL Cholesterol 46; Triglycerides  173; VLDL 35 Lipitor increased from 40 to 80 mg daily LP (a) 158   Hypertension Per the patient, he was taking losartan-HCTZ 50-12.5 mg and toprol 100 mg BID for pressure control. Per Jeremy Gilbert, medications will be resumed.    Transient 2:1 AV block During nocturnal hours on telemetry while on 12.5 mg metoprolol BID. BB held and no further heart block was noted on telemetry. Pt denies pre-syncope and dizziness. No palpitations.   Disordered breathing while sleeping Snoring Nocturnal heart block Recommend sleep study - can set up in OP follow up.    Pt was seen and examined by Jeremy Gilbert and stable for discharge. Follow up has been arranged. He will ultimately follow up with Jeremy Gilbert in the [place] office.    Did the patient have an acute coronary syndrome (MI, NSTEMI, STEMI, etc) this admission?:  Yes                               AHA/ACC Clinical Performance & Quality Measures: Aspirin prescribed? - Yes ADP Receptor Inhibitor (Plavix/Clopidogrel, Brilinta/Ticagrelor or Effient/Prasugrel) prescribed (includes medically managed patients)? - Yes Beta Blocker prescribed? - Yes High Intensity Statin (Lipitor 40-'80mg'$  or Crestor 20-'40mg'$ ) prescribed? - Yes EF assessed during THIS hospitalization? - Yes For EF <40%, was ACEI/ARB prescribed? - Yes For EF <40%, Aldosterone Antagonist (Spironolactone or Eplerenone) prescribed? - Not Applicable (EF >/= 33%) Cardiac Rehab Phase II ordered (including medically managed patients)? - Yes       The patient will be scheduled for a TOC follow up appointment in 7-14 days.  A message has been sent to the [facility] and Scheduling Pool at the office where the patient should be seen for follow up.  _____________  Discharge Vitals Blood pressure (!) 162/79, pulse 95, temperature 98.2 F (36.8 C), temperature source Oral, resp. rate 16, height '5\' 7"'$  (1.702 m), weight 73.8 kg, SpO2 93 %.  Filed Weights   03/13/22 1022  Weight: 73.8 kg    Labs &  Radiologic Studies    CBC Recent Labs    03/14/22 0313 03/15/22 0357  WBC 7.8 9.3  HGB 11.6* 12.1*  HCT 33.0* 34.9*  MCV 92.7 92.8  PLT 161 295   Basic Metabolic Panel Recent Labs    03/14/22 0313 03/15/22 0357  NA 138 137  K 3.6 3.4*  CL 111 109  CO2 20* 17*  GLUCOSE 99 114*  BUN 16 9  CREATININE 1.10 1.04  CALCIUM 8.3* 8.4*   Liver Function Tests No results for input(s): "AST", "ALT", "ALKPHOS", "BILITOT", "PROT", "ALBUMIN" in the last 72 hours. No results for input(s): "LIPASE", "AMYLASE" in the last 72 hours. High Sensitivity Troponin:   Recent Labs  Lab 03/10/22 1010 03/10/22 1355 03/10/22 1606 03/10/22 1750 03/11/22 0902  TROPONINIHS 298* 563* 919* 1,256* 827*    BNP Invalid input(s): "POCBNP" D-Dimer No results for input(s): "DDIMER" in the last 72 hours. Hemoglobin A1C No results for input(s): "HGBA1C" in the last 72 hours.  Fasting Lipid Panel No results for input(s): "CHOL", "HDL", "LDLCALC", "TRIG", "CHOLHDL", "LDLDIRECT" in the last 72 hours. Thyroid Function Tests No results for input(s): "TSH", "T4TOTAL", "T3FREE", "THYROIDAB" in the last 72 hours.  Invalid input(s): "FREET3" _____________  CARDIAC CATHETERIZATION  Addendum Date: 03/14/2022     Prox RCA lesion is 99% stenosed.   Ost RCA to Prox RCA lesion is 70% stenosed.   A stent was successfully placed.   A stent was successfully placed.   Post intervention, there is a 0% residual stenosis.   Post intervention, there is a 0% residual stenosis.   LV end diastolic pressure is mildly elevated. 1.  High-grade tandem right coronary artery lesions treated with 2 overlapping regularly stents with orbital atherectomy and OCT guidance.  The minimal stent area was over 6 mm and there were no distal dissections at the conclusion Gilbert the procedure. 2.  LVEDP Gilbert 22 mmHg. 3.  Successful ultrasound-guided left common femoral central line placement. Recommendation: Dual antiplatelet therapy for at least 1  year.  Result Date: 03/14/2022   Prox RCA lesion is 99% stenosed.   Ost RCA to Prox RCA lesion is 70% stenosed.   A stent was successfully placed.   A stent was successfully placed.   Post intervention, there is a 0% residual stenosis.   Post intervention, there is a 0% residual stenosis.   LV end diastolic pressure is mildly elevated. 1.  High-grade tandem right coronary artery lesions treated with 2 overlapping regularly stents with orbital atherectomy and OCT guidance.  The minimal stent area was over 6 mm and there were no distal dissections at the conclusion Gilbert the procedure. 2.  LVEDP Gilbert 22 mmHg. 3.  Successful ultrasound-guided left common femoral central line placement. Recommendation: Dual antiplatelet therapy for at least 1 year.   CARDIAC CATHETERIZATION  Result Date: 03/12/2022   Prox RCA lesion is 99% stenosed.   Normal left coronary system   Preserved Gilbert left ventricular function EF Gilbert at least 55% Conclusion Inpatient cardiac cath for unstable angina Initially right radial approach for cath Which showed high-grade lesion mid RCA 99% Left system with minor irregularities Preserved left ventricular function Gilbert at least 55% Intervention Initially intervention was attempted from radial approach Able to cross with a wire but unable to cross with the balloon Switched intervention to right groin approach for more support Once again able to cross with a wire, Dr End but unable to cross with the balloon Intervention was aborted Patient remained hemodynamically stable even after multiple episodes Gilbert chest pain and transient ST elevation Patient placed on IV nitroglycerin heparin was to be restarted 2 hours after sheath removal and minx Right radial received a TR band Patient transferred to [facility] for further management   ECHOCARDIOGRAM COMPLETE  Result Date: 03/10/2022    ECHOCARDIOGRAM REPORT   Patient Name:   Jeremy Gilbert Date Gilbert Exam: 03/10/2022 Medical Rec #:  [id number]        Height:        67.0 in Accession #:    [id number]       Weight:       160.1 lb Date Gilbert Birth:  21-Oct-1950        BSA:          1.840 m Patient Age:    71 years         BP:           108/61 mmHg Patient Gender: M  HR:           58 bpm. Exam Location:  [facility] Procedure: 2D Echo, Color Doppler and Cardiac Doppler Indications:     I21.4 NSTEMI  History:         Patient has no prior history Gilbert Echocardiogram examinations.                  Risk Factors:Current Smoker.  Sonographer:     Charmayne Sheer Referring Phys:  Coram Diagnosing Phys: Yolonda Kida MD IMPRESSIONS  1. Left ventricular ejection fraction, by estimation, is 50 to 55%. The left ventricle has low normal function. The left ventricle has no Gilbert wall motion abnormalities. Left ventricular diastolic parameters are consistent with Grade I diastolic dysfunction (impaired relaxation).  2. Right ventricular systolic function is normal. The right ventricular size is normal.  3. Left atrial size was mildly dilated.  4. The mitral valve is normal in structure. Trivial mitral valve regurgitation.  5. The aortic valve is normal in structure. Aortic valve regurgitation is not visualized. FINDINGS  Left Ventricle: Left ventricular ejection fraction, by estimation, is 50 to 55%. The left ventricle has low normal function. The left ventricle has no Gilbert wall motion abnormalities. The left ventricular internal cavity size was normal in size. There is borderline left ventricular hypertrophy. Left ventricular diastolic parameters are consistent with Grade I diastolic dysfunction (impaired relaxation). Right Ventricle: The right ventricular size is normal. No increase in right ventricular wall thickness. Right ventricular systolic function is normal. Left Atrium: Left atrial size was mildly dilated. Right Atrium: Right atrial size was normal in size. Pericardium: There is no evidence Gilbert pericardial effusion. Mitral Valve: The mitral valve is normal in  structure. Trivial mitral valve regurgitation. Tricuspid Valve: The tricuspid valve is normal in structure. Tricuspid valve regurgitation is trivial. Aortic Valve: The aortic valve is normal in structure. Aortic valve regurgitation is not visualized. Aortic valve mean gradient measures 3.0 mmHg. Aortic valve peak gradient measures 5.1 mmHg. Aortic valve area, by VTI measures 2.19 cm. Pulmonic Valve: The pulmonic valve was normal in structure. Pulmonic valve regurgitation is not visualized. Aorta: The ascending aorta was not well visualized. IAS/Shunts: No atrial level shunt detected by color flow Doppler.  LEFT VENTRICLE PLAX 2D LVIDd:         4.57 cm     Diastology LVIDs:         3.74 cm     LV e' medial:    5.98 cm/s LV PW:         1.13 cm     LV E/e' medial:  9.7 LV IVS:        1.02 cm     LV e' lateral:   7.94 cm/s LVOT diam:     1.90 cm     LV E/e' lateral: 7.3 LV SV:         44 LV SV Index:   24 LVOT Area:     2.84 cm  LV Volumes (MOD) LV vol d, MOD A2C: 87.4 ml LV vol d, MOD A4C: 86.3 ml LV vol s, MOD A2C: 39.8 ml LV vol s, MOD A4C: 40.3 ml LV SV MOD A2C:     47.6 ml LV SV MOD A4C:     86.3 ml LV SV MOD BP:      47.9 ml RIGHT VENTRICLE RV Basal diam:  3.08 cm TAPSE (M-mode): 2.6 cm LEFT ATRIUM  Index        RIGHT ATRIUM           Index LA diam:      4.30 cm 2.34 cm/m   RA Area:     13.30 cm LA Vol (A2C): 29.8 ml 16.20 ml/m  RA Volume:   30.40 ml  16.53 ml/m LA Vol (A4C): 39.5 ml 21.47 ml/m  AORTIC VALVE                    PULMONIC VALVE AV Area (Vmax):    2.20 cm     PV Vmax:       0.99 m/s AV Area (Vmean):   1.97 cm     PV Peak grad:  3.9 mmHg AV Area (VTI):     2.19 cm AV Vmax:           113.00 cm/s AV Vmean:          78.900 cm/s AV VTI:            0.201 m AV Peak Grad:      5.1 mmHg AV Mean Grad:      3.0 mmHg LVOT Vmax:         87.50 cm/s LVOT Vmean:        54.900 cm/s LVOT VTI:          0.155 m LVOT/AV VTI ratio: 0.77  AORTA Ao Root diam: 3.40 cm MITRAL VALVE MV Area (PHT): 2.63 cm     SHUNTS MV Decel Time: 288 msec    Systemic VTI:  0.16 m MV E velocity: 57.80 cm/s  Systemic Diam: 1.90 cm MV A velocity: 84.00 cm/s MV E/A ratio:  0.69 Dwayne D Callwood MD Electronically signed by Yolonda Kida MD Signature Date/Time: 03/10/2022/10:35:25 PM    Final    DG Chest 2 View  Result Date: 03/10/2022 CLINICAL DATA:  A 71 year old male presents with chest pain. EXAM: CHEST - 2 VIEW COMPARISON:  CT Gilbert the chest from September Gilbert 2021. FINDINGS: The heart size and mediastinal contours are within normal limits. Both lungs are clear. The visualized skeletal structures are unremarkable. IMPRESSION: No active cardiopulmonary disease. Electronically Signed   By: Zetta Bills M.D.   On: 03/10/2022 10:38    Disposition   Pt is being discharged home today in good condition.  Follow-up Plans & Appointments     Follow-up Information     Ledora Bottcher, [place] Follow up on 03/25/2022.   Specialties: Physician Assistant, Cardiology, Radiology Why: 2:20 pm post cath - [facility] Contact information: [address] [place] [address] [phone]                Discharge Instructions     Amb Referral to Cardiac Rehabilitation   Complete by: As directed    Diagnosis:  Coronary Stents NSTEMI     After initial evaluation and assessments completed: Virtual Based Care may be provided alone or in conjunction with Phase 2 Cardiac Rehab based on patient barriers.: Yes       Discharge Medications   Allergies as Gilbert 03/15/2022       Reactions   Ace Inhibitors Cough   Hydrocodone-acetaminophen Itching   Oxycodone-acetaminophen Other (See Comments)   shaking        Medication List     STOP taking these medications    hydrochlorothiazide 12.5 MG tablet Commonly known as: HYDRODIURIL   losartan 100 MG tablet Commonly known as: COZAAR  TAKE these medications    acetaminophen 500 MG tablet Commonly known as: TYLENOL Take 500 mg by mouth every 6 (six)  hours as needed for mild pain, moderate pain, fever or headache.   aspirin 81 MG chewable tablet Chew 1 tablet (81 mg total) by mouth before cath procedure.   atorvastatin 80 MG tablet Commonly known as: LIPITOR Take 1 tablet (80 mg total) by mouth daily. What changed:  medication strength how much to take   losartan-hydrochlorothiazide 50-12.5 MG tablet Commonly known as: HYZAAR Take 1 tablet by mouth daily.   metoprolol succinate 100 MG 24 hr tablet Commonly known as: TOPROL-XL Take 1 tablet by mouth 2 (two) times daily.   ticagrelor 90 MG Tabs tablet Commonly known as: BRILINTA Take 1 tablet (90 mg total) by mouth 2 (two) times daily.           Outstanding Labs/Studies   Change chewable ASA to [facility]  Duration Gilbert Discharge Encounter   Greater than 30 minutes including physician time.  Signed, Tami Lin Duke, PA 03/15/2022, 10:32 AM   Agree with note by Fabian Sharp PA-C  Mr. Masi was [facility] from [facility] after failed attempt RCA intervention by Jeremy Gilbert.  He did have a non-STEMI.  He underwent successful RCA PCI and drug-eluting stenting along with orbital atherectomy Gilbert the proximal and mid dominant RCA by JeremyThukkani via the right femoral approach.  He was stable this morning on exam.  His right common femoral puncture site was stable as were his labs.  He has not had no chest pain.  He is deemed appropriate for discharge on DAPT.  He will obtain a TOC 7 and then follow-up with Jeremy Gilbert as an outpatient.  Lorretta Harp, M.D., [place], [facility], [place] [address] [place], [place]  [address]  [phone] 03/15/2022 1:42 PM

## 2022-03-15 NOTE — Telephone Encounter (Signed)
-----   Message from Ledora Bottcher, [place] sent at 03/15/2022 10:31 AM EDT ----- Pt will need a TOC phone call - discharging this afternoon.   Thanks Angie

## 2022-03-15 NOTE — Progress Notes (Addendum)
Progress Note  Patient Name: Jeremy Gilbert Date of Encounter: 03/15/2022  [facility] Cardiologist: Nelva Bush, MD   Subjective   Pt sitting up eating, no complaints. Has not walked yet.  Inpatient Medications    Scheduled Meds:  aspirin  81 mg Oral Daily   atorvastatin  80 mg Oral Daily   insulin aspart  0-15 Units Subcutaneous TID WC   sodium chloride flush  3 mL Intravenous Q12H   sodium chloride flush  3 mL Intravenous Q12H   sodium chloride flush  3 mL Intravenous Q12H   sodium chloride flush  3 mL Intravenous Q12H   ticagrelor  90 mg Oral BID   Continuous Infusions:  sodium chloride     PRN Meds: sodium chloride, acetaminophen, ALPRAZolam, ondansetron (ZOFRAN) IV, sodium chloride flush, sodium chloride flush   Vital Signs    Vitals:   03/14/22 2100 03/14/22 2159 03/14/22 2332 03/15/22 0455  BP: (!) 143/71 137/66 (!) 154/67 (!) 154/74  Pulse: 90 93 92 84  Resp: 18 16    Temp: 98.1 F (36.7 C) 98.4 F (36.9 C)  98.2 F (36.8 C)  TempSrc: Oral Oral  Oral  SpO2: 93% 95% 94% 93%  Weight:      Height:        Intake/Output Summary (Last 24 hours) at 03/15/2022 0648 Last data filed at 03/15/2022 0300 Gross per 24 hour  Intake 448.4 ml  Output 1400 ml  Net -951.6 ml      03/13/2022   10:22 AM 03/11/2022   12:25 PM 03/10/2022    6:00 PM  Last 3 Weights  Weight (lbs) 162 lb 12.8 oz 162 lb 163 lb 1.8 oz  Weight (kg) 73.846 kg 73.483 kg 73.986 kg      Telemetry    Sinus rhythm HR 80-90s - Personally Reviewed  ECG    Sinus rhythm HR 80, nonspecific ST changes iRBBB - Personally Reviewed  Physical Exam   GEN: No acute distress.   Neck: No JVD Cardiac: RRR, no murmurs, rubs, or gallops.  Respiratory: Clear to auscultation bilaterally. GI: Soft, nontender, non-distended  MS: No edema; No deformity. Neuro:  Nonfocal  Psych: Normal affect  Bilateral groin sites C/D/I  Labs    High Sensitivity Troponin:   Recent Labs  Lab 03/10/22 1010  03/10/22 1355 03/10/22 1606 03/10/22 1750 03/11/22 0902  TROPONINIHS 298* 563* 919* 1,256* 827*     Chemistry Recent Labs  Lab 03/10/22 1010 03/11/22 0359 03/13/22 0633 03/14/22 0313 03/15/22 0357  NA 134*   < > 137 138 137  K 3.6   < > 3.3* 3.6 3.4*  CL 101   < > 109 111 109  CO2 22   < > 20* 20* 17*  GLUCOSE 288*   < > 108* 99 114*  BUN 23   < > '14 16 9  '$ CREATININE 1.30*   < > 1.11 1.10 1.04  CALCIUM 9.2   < > 8.2* 8.3* 8.4*  MG 2.2  --   --   --   --   PROT 8.1  --   --   --   --   ALBUMIN 4.4  --   --   --   --   AST 33  --   --   --   --   ALT 23  --   --   --   --   ALKPHOS 95  --   --   --   --  BILITOT 0.9  --   --   --   --   GFRNONAA 58*   < > >60 >60 >60  ANIONGAP 11   < > '8 7 11   '$ < > = values in this interval not displayed.    Lipids  Recent Labs  Lab 03/11/22 0359  CHOL 109  TRIG 173*  HDL 28*  LDLCALC 46  CHOLHDL 3.9    Hematology Recent Labs  Lab 03/13/22 0633 03/14/22 0313 03/15/22 0357  WBC 9.3 7.8 9.3  RBC 3.54* 3.56* 3.76*  HGB 11.7* 11.6* 12.1*  HCT 33.1* 33.0* 34.9*  MCV 93.5 92.7 92.8  MCH 33.1 32.6 32.2  MCHC 35.3 35.2 34.7  RDW 12.2 12.3 12.4  PLT 148* 161 179   Thyroid  Recent Labs  Lab 03/10/22 1010  TSH 0.951    BNPNo results for input(s): "BNP", "PROBNP" in the last 168 hours.  DDimer No results for input(s): "DDIMER" in the last 168 hours.   Radiology    CARDIAC CATHETERIZATION  Addendum Date: 03/14/2022     Prox RCA lesion is 99% stenosed.   Ost RCA to Prox RCA lesion is 70% stenosed.   A stent was successfully placed.   A stent was successfully placed.   Post intervention, there is a 0% residual stenosis.   Post intervention, there is a 0% residual stenosis.   LV end diastolic pressure is mildly elevated. 1.  High-grade tandem right coronary artery lesions treated with 2 overlapping regularly stents with orbital atherectomy and OCT guidance.  The minimal stent area was over 6 mm and there were no distal  dissections at the conclusion of the procedure. 2.  LVEDP of 22 mmHg. 3.  Successful ultrasound-guided left common femoral central line placement. Recommendation: Dual antiplatelet therapy for at least 1 year.  Result Date: 03/14/2022   Prox RCA lesion is 99% stenosed.   Ost RCA to Prox RCA lesion is 70% stenosed.   A stent was successfully placed.   A stent was successfully placed.   Post intervention, there is a 0% residual stenosis.   Post intervention, there is a 0% residual stenosis.   LV end diastolic pressure is mildly elevated. 1.  High-grade tandem right coronary artery lesions treated with 2 overlapping regularly stents with orbital atherectomy and OCT guidance.  The minimal stent area was over 6 mm and there were no distal dissections at the conclusion of the procedure. 2.  LVEDP of 22 mmHg. 3.  Successful ultrasound-guided left common femoral central line placement. Recommendation: Dual antiplatelet therapy for at least 1 year.    Cardiac Studies   Left heart cath 03/14/22:   Prox RCA lesion is 99% stenosed.   Ost RCA to Prox RCA lesion is 70% stenosed.   A stent was successfully placed.   A stent was successfully placed.   Post intervention, there is a 0% residual stenosis.   Post intervention, there is a 0% residual stenosis.   LV end diastolic pressure is mildly elevated.   1.  High-grade tandem right coronary artery lesions treated with 2 overlapping regularly stents with orbital atherectomy and OCT guidance.  The minimal stent area was over 6 mm and there were no distal dissections at the conclusion of the procedure. 2.  LVEDP of 22 mmHg. 3.  Successful ultrasound-guided left common femoral central line placement.   Recommendation: Dual antiplatelet therapy for at least 1 year.   Echo 03/10/22: 1. Left ventricular ejection fraction, by estimation, is 50  to 55%. The  left ventricle has low normal function. The left ventricle has no regional  wall motion abnormalities. Left  ventricular diastolic parameters are  consistent with Grade I diastolic  dysfunction (impaired relaxation).   2. Right ventricular systolic function is normal. The right ventricular  size is normal.   3. Left atrial size was mildly dilated.   4. The mitral valve is normal in structure. Trivial mitral valve  regurgitation.   5. The aortic valve is normal in structure. Aortic valve regurgitation is  not visualized.   Patient Profile     71 y.o. male with past medical history of diabetes mellitus, hypertension admitted with non-ST elevation myocardial infarction to [place] [facility].  Attempt at PCI of the right coronary artery was unsuccessful and patient transferred to [facility] for consideration of further intervention.  Assessment & Plan    NSTEMI CAD Pt taken back to the cath lab. Angiography demonstrated tandem lesion in the RCA of 70% and 99% stenosis treated with overlapping DES x 2 with orbital atherectomy and OCT.  He tolerated the procedure well and was started on DAPT with ASA and brilinta.   Hyperlipidemia with LDL goal < 70 03/11/2022: Cholesterol 109; HDL 28; LDL Cholesterol 46; Triglycerides 173; VLDL 35 Continue lipitor 80 mg, increased from 40 mg LP (a) 158   Hypertension  Continue  home therapy - losartan-HCTZ 50-12.5 mg daily.   Transient 2:1 AV block During nocturnal hours on telemetry Holding home 200 mg toprol. Apparently was on 12.5 mg metoprolol tartrate when AV block was noted. Consider outpatient heart monitor. May need to consider sleep study to rule out OSA - he snores and has some disordered breathing when sleeping.    Needs to ambulate with cardiac rehab.    For questions or updates, please contact [place] Please consult [email or web address] for contact info under        Signed, Ledora Bottcher, PA  03/15/2022, 6:48 AM     Agree with note by Fabian Sharp PA-C  Jeremy Gilbert had complex although successful proximal mid RCA intervention by  Dr.Thukkani yesterday.  He went via the femoral approach ultimately upsizing to an 8 French sheath because of bruising around the 6 [place] sheath.  He performed stenting of the proximal RCA and orbital atherectomy followed by PCI drug-eluting stenting of the mid RCA using OCT guidance.  The final angiographic result was excellent.  He had no recurrent chest pain.  His groin is stable and his exam is benign.  His blood pressure is elevated today although he is not taken any of his home blood pressure medications which we will restart.  We will order an outpatient sleep study as well.  He wishes to follow-up with Dr. Saunders Revel in our [place] office.  Will arrange TOC 7 followed by outpatient follow-up with Dr. Saunders Revel in 4 to 6 weeks.  He will need to be on DAPT uninterrupted for at least 12 months.  Atorvastatin was increased from 40 to 80 mg.  His LP(a) was elevated as well.  Lorretta Harp, M.D., [place], [facility], [place] [address] [place], [place]  [address]  [phone] 03/15/2022 9:11 AM

## 2022-03-15 NOTE — Care Management Important Message (Signed)
Important Message  Patient Details  Name: Jeremy Gilbert MRN: [id number] Date of Birth: [birth year]   Medicare Important Message Given:  Yes     Shelda Altes 03/15/2022, 12:00 PM

## 2022-03-15 NOTE — Telephone Encounter (Signed)
Patient currently admitted

## 2022-03-15 NOTE — Progress Notes (Signed)
CARDIAC REHAB PHASE I   PRE:  Rate/Rhythm: 93/ SR  BP:  Supine: 163/80   SaO2: 94  MODE:  Ambulation: 450 ft   POST:  Rate/Rhythem: 132/ST BP:  Supine: 178/92     SaO2: 94  Pt tolerated exercise well with no s/s. Pt and family educated on importance of ASA, brilinta, nitro, and statins. Pt given heart healthy and diabetic diets, MI book. Reviewed site care, restrictions and ex guidelines. Pt and family denied questions and concerns, will refer to [place].  [place], MS 03/15/2022 9:30 AM

## 2022-03-16 NOTE — Telephone Encounter (Signed)
Patient contacted regarding discharge from  [facility] on 03/15/22.  Patient understands to follow up with provider [facility] on 03/25/22 at 2:20 pm at [facility]. Patient understands discharge instructions? yes  Patient understands medications and regiment? yes  Patient understands to bring all medications to this visit? yes

## 2022-03-17 ENCOUNTER — Telehealth: Payer: Self-pay | Admitting: Physician Assistant

## 2022-03-17 ENCOUNTER — Other Ambulatory Visit: Payer: Self-pay

## 2022-03-17 ENCOUNTER — Ambulatory Visit (HOSPITAL_COMMUNITY)
Admission: RE | Admit: 2022-03-17 | Discharge: 2022-03-17 | Disposition: A | Discharge status: Home / Self Care | Payer: Medicare Other | Source: Ambulatory Visit | Attending: Cardiology | Admitting: Cardiology

## 2022-03-17 DIAGNOSIS — I97630 Postprocedural hematoma of a circulatory system organ or structure following a cardiac catheterization: Secondary | ICD-10-CM

## 2022-03-17 DIAGNOSIS — S301XXA Contusion of abdominal wall, initial encounter: Secondary | ICD-10-CM | POA: Diagnosis not present

## 2022-03-17 NOTE — Telephone Encounter (Signed)
Patient reports medication reaction: change in breathing pattern, has to catch breath several times during day and night, hot flashes, weakness, can't sleep. Denies edema or chest pain. He was started on brilinta, atorvastatin, and losartan at d/c. He also reports his rt groin has a 1/2 in diameter hematoma that has been oozing since d/c on 8/8. Wife stated it looks a bit bigger. There is bruising at the site and on thigh. Left groin negative. Rt wrist negative and without numbness or tingling in hand. Dr. Harriet Masson (DOD) advised. Order for [place] of rt groin and appointment with her tomorrow. Patient informed and will come for [place] today and appointment with Dr. Harriet Masson in the morning. Order placed for [place] of rt groin.

## 2022-03-17 NOTE — Telephone Encounter (Signed)
  Pt c/o medication issue:  1. Name of Medication: atorvastatin (LIPITOR) 80 MG tablet  metoprolol succinate (TOPROL-XL) 100 MG 24 hr tablet   2. How are you currently taking this medication (dosage and times per day)? As written   3. Are you having a reaction (difficulty breathing--STAT)?   4. What is your medication issue? Pt said, since he started taking this medication, he is having side effects. He can't sleep, sob while laying down, hot flashes and weakness.

## 2022-03-18 ENCOUNTER — Ambulatory Visit (INDEPENDENT_AMBULATORY_CARE_PROVIDER_SITE_OTHER): Payer: Medicare Other | Admitting: Cardiology

## 2022-03-18 ENCOUNTER — Encounter: Payer: Self-pay | Admitting: Cardiology

## 2022-03-18 VITALS — BP 138/76 | HR 74 | Ht 67.0 in | Wt 160.6 lb

## 2022-03-18 DIAGNOSIS — I252 Old myocardial infarction: Secondary | ICD-10-CM | POA: Diagnosis not present

## 2022-03-18 DIAGNOSIS — I2510 Atherosclerotic heart disease of native coronary artery without angina pectoris: Secondary | ICD-10-CM | POA: Diagnosis not present

## 2022-03-18 DIAGNOSIS — E1169 Type 2 diabetes mellitus with other specified complication: Secondary | ICD-10-CM | POA: Diagnosis not present

## 2022-03-18 DIAGNOSIS — I10 Essential (primary) hypertension: Secondary | ICD-10-CM | POA: Diagnosis not present

## 2022-03-18 DIAGNOSIS — E785 Hyperlipidemia, unspecified: Secondary | ICD-10-CM

## 2022-03-18 MED ORDER — ROSUVASTATIN CALCIUM 40 MG PO TABS: 40.0000 mg | ORAL_TABLET | Freq: Every day | ORAL | 3 refills | Status: DC

## 2022-03-18 NOTE — Patient Instructions (Addendum)
Medication Instructions:  Your physician has recommended you make the following change in your medication:  STOP TODAY: Lipitor  START SUNDAY: Crestor 40 mg nightly *If you need a refill on your cardiac medications before your next appointment, please call your pharmacy*   Lab Work: None If you have labs (blood work) drawn today and your tests are completely normal, you will receive your results only by: [place] (if you have MyChart) OR A paper copy in the mail If you have any lab test that is abnormal or we need to change your treatment, we will call you to review the results.   Testing/Procedures: None   Follow-Up: At [facility], you and your health needs are our priority.  As part of our continuing mission to provide you with exceptional heart care, we have created designated Provider Care Teams.  These Care Teams include your primary Cardiologist (physician) and Advanced Practice Providers (APPs -  Physician Assistants and Nurse Practitioners) who all work together to provide you with the care you need, when you need it.  We recommend signing up for the patient portal called "MyChart".  Sign up information is provided on this After Visit Summary.  MyChart is used to connect with patients for Virtual Visits (Telemedicine).  Patients are able to view lab/test results, encounter notes, upcoming appointments, etc.  Non-urgent messages can be sent to your provider as well.   To learn more about what you can do with MyChart, go to [email or web address].    Your next appointment:   3 month(s)  The format for your next appointment:   In Person  Provider:   Nelva Bush, MD     Other Instructions   Important Information About Sugar

## 2022-03-18 NOTE — Progress Notes (Signed)
Cardiology Office Note:    Date:  03/18/2022   ID:  Jeremy Gilbert, DOB 1951-07-10, MRN [id number]  PCP:  Juluis Pitch, MD  Cardiologist:  Nelva Bush, MD  Electrophysiologist:  None   Referring MD: Juluis Pitch, MD   " The Lipitor is driving me crazy"    History of Present Illness:    Jeremy Gilbert is a 71 y.o. male with a hx of diabetes mellitus, hypertension, coronary artery disease status post recent PCI with drug-eluting stent to the RCA, orbital atherectomy and OCT guidance, hyperlipidemia, transient 2-1 AV block which resolved after being taken off beta-blocker.  The patient called yesterday reporting that he was experiencing significant shortness of breath and gasping of air.  He thinks that is the Lipitor.  He said whenever he takes Lipitor about 30 minutes later he feels that shortness of breath.  He does not believe that it is associated with his Brilinta.  He also yesterday came in for a groin ultrasound because of the pain.  It was discussed with me when I was DOD yesterday because he was having significant pain in the asked that the patient come and get this ultrasound done.  Past Medical History:  Diagnosis Date   Actinic keratosis    Basal cell carcinoma 2005   L alar crease   Tobacco abuse 01/27/2015    Past Surgical History:  Procedure Laterality Date   CENTRAL LINE INSERTION  03/14/2022   Procedure: CENTRAL LINE INSERTION;  Surgeon: Early Osmond, MD;  Location: [facility];  Service: Cardiovascular;;   CORONARY ATHERECTOMY N/A 03/14/2022   Procedure: CORONARY ATHERECTOMY;  Surgeon: Early Osmond, MD;  Location: [facility];  Service: Cardiovascular;  Laterality: N/A;   CORONARY STENT INTERVENTION N/A 03/11/2022   Procedure: CORONARY STENT INTERVENTION;  Surgeon: Yolonda Kida, MD;  Location: [facility];  Service: Cardiovascular;  Laterality: N/A;   CORONARY STENT INTERVENTION N/A 03/14/2022   Procedure: CORONARY  STENT INTERVENTION;  Surgeon: Early Osmond, MD;  Location: [facility];  Service: Cardiovascular;  Laterality: N/A;   INTRAVASCULAR IMAGING/OCT N/A 03/14/2022   Procedure: INTRAVASCULAR IMAGING/OCT;  Surgeon: Early Osmond, MD;  Location: [facility];  Service: Cardiovascular;  Laterality: N/A;   LEFT HEART CATH N/A 03/14/2022   Procedure: Left Heart Cath;  Surgeon: Early Osmond, MD;  Location: [facility];  Service: Cardiovascular;  Laterality: N/A;   LEFT HEART CATH AND CORONARY ANGIOGRAPHY N/A 03/11/2022   Procedure: LEFT HEART CATH AND CORONARY ANGIOGRAPHY;  Surgeon: Yolonda Kida, MD;  Location: [facility];  Service: Cardiovascular;  Laterality: N/A;  following DR. Callwood's first case   OTHER SURGICAL HISTORY Right 04/08/1994   Right external fixation of tibia and fibia    Current Medications: Current Meds  Medication Sig   acetaminophen (TYLENOL) 500 MG tablet Take 500 mg by mouth every 6 (six) hours as needed for mild pain, moderate pain, fever or headache.   aspirin 81 MG chewable tablet Chew 1 tablet (81 mg total) by mouth before cath procedure.   losartan-hydrochlorothiazide (HYZAAR) 50-12.5 MG tablet Take 1 tablet by mouth daily.   metoprolol succinate (TOPROL-XL) 100 MG 24 hr tablet Take 1 tablet by mouth 2 (two) times daily.   rosuvastatin (CRESTOR) 40 MG tablet Take 1 tablet (40 mg total) by mouth daily.   ticagrelor (BRILINTA) 90 MG TABS tablet Take 1 tablet (90 mg total) by mouth 2 (  two) times daily.   [DISCONTINUED] atorvastatin (LIPITOR) 80 MG tablet Take 1 tablet (80 mg total) by mouth daily.     Allergies:   Ace inhibitors, Hydrocodone-acetaminophen, and Oxycodone-acetaminophen   Social History   Socioeconomic History   Marital status: Married    Spouse name: Jeremy Gilbert   Number of children: 2   Years of education: Not on file   Highest education level: Not on file  Occupational History   Not on file  Tobacco Use   Smoking  status: Former    Types: Cigarettes    Start date: 01/07/1968    Quit date: 01/07/2000    Years since quitting: 22.2   Smokeless tobacco: Never  Vaping Use   Vaping Use: Never used  Substance and Sexual Activity   Alcohol use: Yes    Alcohol/week: 18.0 standard drinks of alcohol    Types: 18 Cans of beer per week   Drug use: Never   Sexual activity: Not Currently  Other Topics Concern   Not on file  Social History Narrative   Not on file   Social Determinants of Health   Financial Resource Strain: Not on file  Food Insecurity: Not on file  Transportation Needs: Not on file  Physical Activity: Not on file  Stress: Not on file  Social Connections: Not on file     Family History: The patient's family history is not on file.  ROS:   Review of Systems  Constitution: Negative for decreased appetite, fever and weight gain.  HENT: Negative for congestion, ear discharge, hoarse voice and sore throat.   Eyes: Negative for discharge, redness, vision loss in right eye and visual halos.  Cardiovascular: Negative for chest pain, dyspnea on exertion, leg swelling, orthopnea and palpitations.  Respiratory: Negative for cough, hemoptysis, shortness of breath and snoring.   Endocrine: Negative for heat intolerance and polyphagia.  Hematologic/Lymphatic: Negative for bleeding problem. Does not bruise/bleed easily.  Skin: Negative for flushing, nail changes, rash and suspicious lesions.  Musculoskeletal: Negative for arthritis, joint pain, muscle cramps, myalgias, neck pain and stiffness.  Gastrointestinal: Negative for abdominal pain, bowel incontinence, diarrhea and excessive appetite.  Genitourinary: Negative for decreased libido, genital sores and incomplete emptying.  Neurological: Negative for brief paralysis, focal weakness, headaches and loss of balance.  Psychiatric/Behavioral: Negative for altered mental status, depression and suicidal ideas.  Allergic/Immunologic: Negative for HIV  exposure and persistent infections.    EKGs/Labs/Other Studies Reviewed:    The following studies were reviewed today:   EKG:  The ekg ordered today demonstrates sinus rhythm, heart rate 74 bpm, evidence of old inferior wall infarction.   Left heart cath 03/14/22:   Prox RCA lesion is 99% stenosed.   Ost RCA to Prox RCA lesion is 70% stenosed.   A stent was successfully placed.   A stent was successfully placed.   Post intervention, there is a 0% residual stenosis.   Post intervention, there is a 0% residual stenosis.   LV end diastolic pressure is mildly elevated.   1.  High-grade tandem right coronary artery lesions treated with 2 overlapping regularly stents with orbital atherectomy and OCT guidance.  The minimal stent area was over 6 mm and there were no distal dissections at the conclusion of the procedure. 2.  LVEDP of 22 mmHg. 3.  Successful ultrasound-guided left common femoral central line placement.   Recommendation: Dual antiplatelet therapy for at least 1 year.     Echo 03/10/22: 1. Left ventricular ejection fraction, by estimation,  is 50 to 55%. The  left ventricle has low normal function. The left ventricle has no regional  wall motion abnormalities. Left ventricular diastolic parameters are  consistent with Grade I diastolic  dysfunction (impaired relaxation).   2. Right ventricular systolic function is normal. The right ventricular  size is normal.   3. Left atrial size was mildly dilated.   4. The mitral valve is normal in structure. Trivial mitral valve  regurgitation.   5. The aortic valve is normal in structure. Aortic valve regurgitation is  not visualized.   Recent Labs: 03/10/2022: ALT 23; Magnesium 2.2; TSH 0.951 03/15/2022: BUN 9; Creatinine, Ser 1.04; Hemoglobin 12.1; Platelets 179; Potassium 3.4; Sodium 137  Recent Lipid Panel    Component Value Date/Time   CHOL 109 03/11/2022 0359   TRIG 173 (H) 03/11/2022 0359   HDL 28 (L) 03/11/2022 0359    CHOLHDL 3.9 03/11/2022 0359   VLDL 35 03/11/2022 0359   LDLCALC 46 03/11/2022 0359    Physical Exam:    VS:  BP 138/76   Pulse 74   Ht '5\' 7"'$  (1.702 m)   Wt 160 lb 9.6 oz (72.8 kg)   SpO2 97%   BMI 25.15 kg/m     Wt Readings from Last 3 Encounters:  03/18/22 160 lb 9.6 oz (72.8 kg)  03/13/22 162 lb 12.8 oz (73.8 kg)  03/11/22 162 lb (73.5 kg)     GEN: Well nourished, well developed in no acute distress HEENT: Normal NECK: No JVD; No carotid bruits LYMPHATICS: No lymphadenopathy CARDIAC: S1S2 noted,RRR, no murmurs, rubs, gallops RESPIRATORY:  Clear to auscultation without rales, wheezing or rhonchi  ABDOMEN: Soft, non-tender, non-distended, +bowel sounds, no guarding. EXTREMITIES: No edema, No cyanosis, no clubbing MUSCULOSKELETAL:  No deformity  SKIN: Warm and dry NEUROLOGIC:  Alert and oriented x 3, non-focal PSYCHIATRIC:  Normal affect, good insight  ASSESSMENT:    1. Coronary artery disease involving native coronary artery of native heart without angina pectoris   2. Hx of non-ST elevation myocardial infarction (NSTEMI)   3. Dyslipidemia associated with type 2 diabetes mellitus ([place])   4. Essential hypertension    PLAN:     We discussed his Lipitor, what we will do today stop the Lipitor and start him on Crestor 40 mg daily.  I am hoping this will help. In terms of his dual antiplatelet therapy he will remain on his aspirin and Brilinta. Diabetes is being managed by his primary team.   Prefers to follow-up with his regular cardiologist Dr.End  in 3 months.  The patient is in agreement with the above plan. The patient left the office in stable condition.  The patient will follow up in   Medication Adjustments/Labs and Tests Ordered: Current medicines are reviewed at length with the patient today.  Concerns regarding medicines are outlined above.  Orders Placed This Encounter  Procedures   AMB referral to cardiac rehabilitation   EKG 12-Lead   Meds ordered  this encounter  Medications   rosuvastatin (CRESTOR) 40 MG tablet    Sig: Take 1 tablet (40 mg total) by mouth daily.    Dispense:  90 tablet    Refill:  3    Patient Instructions  Medication Instructions:  Your physician has recommended you make the following change in your medication:  STOP TODAY: Lipitor  START SUNDAY: Crestor 40 mg nightly *If you need a refill on your cardiac medications before your next appointment, please call your pharmacy*   Lab Work:  None If you have labs (blood work) drawn today and your tests are completely normal, you will receive your results only by: [place] (if you have MyChart) OR A paper copy in the mail If you have any lab test that is abnormal or we need to change your treatment, we will call you to review the results.   Testing/Procedures: None   Follow-Up: At [facility], you and your health needs are our priority.  As part of our continuing mission to provide you with exceptional heart care, we have created designated Provider Care Teams.  These Care Teams include your primary Cardiologist (physician) and Advanced Practice Providers (APPs -  Physician Assistants and Nurse Practitioners) who all work together to provide you with the care you need, when you need it.  We recommend signing up for the patient portal called "MyChart".  Sign up information is provided on this After Visit Summary.  MyChart is used to connect with patients for Virtual Visits (Telemedicine).  Patients are able to view lab/test results, encounter notes, upcoming appointments, etc.  Non-urgent messages can be sent to your provider as well.   To learn more about what you can do with MyChart, go to [email or web address].    Your next appointment:   3 month(s)  The format for your next appointment:   In Person  Provider:   Nelva Bush, MD     Other Instructions   Important Information About Sugar         Adopting a Healthy  Lifestyle.  Know what a healthy weight is for you (roughly BMI <25) and aim to maintain this   Aim for 7+ servings of fruits and vegetables daily   65-80+ fluid ounces of water or unsweet tea for healthy kidneys   Limit to max 1 drink of alcohol per day; avoid smoking/tobacco   Limit animal fats in diet for cholesterol and heart health - choose grass fed whenever available   Avoid highly processed foods, and foods high in saturated/trans fats   Aim for low stress - take time to unwind and care for your mental health   Aim for 150 min of moderate intensity exercise weekly for heart health, and weights twice weekly for bone health   Aim for 7-9 hours of sleep daily   When it comes to diets, agreement about the perfect plan isnt easy to find, even among the experts. Experts at the [employer] developed an idea known as the Healthy Eating Plate. Just imagine a plate divided into logical, healthy portions.   The emphasis is on diet quality:   Load up on vegetables and fruits - one-half of your plate: Aim for color and variety, and remember that potatoes dont count.   Go for whole grains - one-quarter of your plate: Whole wheat, barley, wheat berries, quinoa, oats, brown rice, and foods made with them. If you want pasta, go with whole wheat pasta.   Protein power - one-quarter of your plate: Fish, chicken, beans, and nuts are all healthy, versatile protein sources. Limit red meat.   The diet, however, does go beyond the plate, offering a few other suggestions.   Use healthy plant oils, such as olive, canola, soy, corn, sunflower and peanut. Check the labels, and avoid partially hydrogenated oil, which have unhealthy trans fats.   If youre thirsty, drink water. Coffee and tea are good in moderation, but skip sugary drinks and limit milk and dairy products to one or two daily servings.  The type of carbohydrate in the diet is more important than the amount. Some  sources of carbohydrates, such as vegetables, fruits, whole grains, and beans-are healthier than others.   Finally, stay active  Signed, Berniece Salines, DO  03/18/2022 9:18 AM    [place]

## 2022-03-21 ENCOUNTER — Telehealth: Payer: Self-pay | Admitting: Cardiology

## 2022-03-21 NOTE — Telephone Encounter (Signed)
Pt c/o medication issue:  1. Name of Medication:  rosuvastatin (CRESTOR) 40 MG tablet  2. How are you currently taking this medication (dosage and times per day)?  1 tablet by mouth, patient states he took his first tablet last night   3. Are you having a reaction (difficulty breathing--STAT)?   4. What is your medication issue?   Patient states he took his first tablet last night, but still feels bad overall. Having muscle aches, as if he has the flu. Patient also mentions pain in his groin that radiates down his right leg, involuntary muscle movements, and hot flashes. Please advise.

## 2022-03-21 NOTE — Telephone Encounter (Signed)
I called patient, advised of message below- patient states he will NOT be taking any more Crestor or statin medication. He states he would love to do something else that was not a statin.  I advised I would send a message over to MD to review.  Patient verbalized understanding.

## 2022-03-21 NOTE — Telephone Encounter (Signed)
These symptoms should not be coming from a single dose of Crestor. May still be Lipitor clearing out of his system since he didn't have any washout period in between changing his statin on 8/11 office visit, likely due to recent NSTEMI < 2 weeks ago. Would encourage him to continue on Crestor and can let us know if symptoms don't improve in the next week or so. If they do not, could try decreasing Crestor dose to '20mg'$  daily at that time.

## 2022-03-23 NOTE — Telephone Encounter (Signed)
Called pt to relay message. Pt advised it's recommended he take the medication. However if he chooses not to we do ask he make Dr. Saunders Revel aware of the symptoms he experienced. He verbalized understanding. No further questions at this time.

## 2022-03-25 ENCOUNTER — Ambulatory Visit: Payer: Medicare Other | Admitting: Physician Assistant

## 2022-03-29 ENCOUNTER — Encounter: Payer: Medicare Other | Attending: Internal Medicine

## 2022-03-29 DIAGNOSIS — Z955 Presence of coronary angioplasty implant and graft: Secondary | ICD-10-CM

## 2022-03-29 DIAGNOSIS — I214 Non-ST elevation (NSTEMI) myocardial infarction: Secondary | ICD-10-CM

## 2022-03-29 NOTE — Progress Notes (Signed)
Virtual orientation call completed today. He has an appointment on Date: 04/04/22  for EP eval and gym Orientation.  Documentation of diagnosis can be found in [facility] Date: 03/11/22 .

## 2022-04-04 ENCOUNTER — Ambulatory Visit: Payer: Medicare Other

## 2022-04-14 ENCOUNTER — Other Ambulatory Visit (HOSPITAL_COMMUNITY): Payer: Self-pay

## 2022-04-15 ENCOUNTER — Other Ambulatory Visit (HOSPITAL_COMMUNITY): Payer: Self-pay

## 2022-04-28 ENCOUNTER — Encounter: Payer: Medicare Other | Attending: Internal Medicine | Admitting: *Deleted

## 2022-04-28 ENCOUNTER — Other Ambulatory Visit: Payer: Self-pay | Admitting: Internal Medicine

## 2022-04-28 VITALS — Ht 66.4 in | Wt 163.4 lb

## 2022-04-28 DIAGNOSIS — Z955 Presence of coronary angioplasty implant and graft: Secondary | ICD-10-CM | POA: Insufficient documentation

## 2022-04-28 DIAGNOSIS — I214 Non-ST elevation (NSTEMI) myocardial infarction: Secondary | ICD-10-CM | POA: Diagnosis present

## 2022-04-28 MED ORDER — NITROGLYCERIN 0.4 MG SL SUBL: 0.4000 mg | SUBLINGUAL_TABLET | SUBLINGUAL | 99 refills | Status: DC | PRN

## 2022-04-28 NOTE — Progress Notes (Signed)
Cardiac Individual Treatment Plan  Patient Details  Name: Jeremy Gilbert MRN: [id number] Date of Birth: 06/21/1951 Referring Provider:   Flowsheet Row Cardiac Rehab from 04/28/2022 in [facility] Cardiac and Pulmonary Rehab  Referring Provider End, Harrell Gave MD       Initial Encounter Date:  Flowsheet Row Cardiac Rehab from 04/28/2022 in [facility] Cardiac and Pulmonary Rehab  Date 04/28/22       Visit Diagnosis: NSTEMI (non-ST elevation myocardial infarction) [facility])  Status post coronary artery stent placement  Patient's Home Medications on Admission:  Current Outpatient Medications:    acetaminophen (TYLENOL) 500 MG tablet, Take 500 mg by mouth every 6 (six) hours as needed for mild pain, moderate pain, fever or headache., Disp: , Rfl:    losartan-hydrochlorothiazide (HYZAAR) 50-12.5 MG tablet, Take 1 tablet by mouth daily., Disp: , Rfl:    metoprolol succinate (TOPROL-XL) 100 MG 24 hr tablet, Take 1 tablet by mouth 2 (two) times daily., Disp: , Rfl:    rosuvastatin (CRESTOR) 40 MG tablet, Take 1 tablet (40 mg total) by mouth daily. (Patient not taking: Reported on 03/29/2022), Disp: 90 tablet, Rfl: 3   ticagrelor (BRILINTA) 90 MG TABS tablet, Take 1 tablet (90 mg total) by mouth 2 (two) times daily., Disp: 60 tablet, Rfl: 11 No current facility-administered medications for this visit.  Facility-Administered Medications Ordered in Other Visits:    iohexol (OMNIPAQUE) 300 MG/ML solution, , , PRN, Callwood, Dwayne D, MD, 350 mL at 03/11/22 1610  Past Medical History: Past Medical History:  Diagnosis Date   Actinic keratosis    Basal cell carcinoma 2005   L alar crease   Tobacco abuse 01/27/2015    Tobacco Use: Social History   Tobacco Use  Smoking Status Former   Types: Cigarettes   Start date: 01/07/1968   Quit date: 01/07/2000   Years since quitting: 22.3  Smokeless Tobacco Never    Labs: Review Flowsheet       Latest Ref Rng & Units 03/10/2022 03/11/2022  Labs for ITP  Cardiac and Pulmonary Rehab  Cholestrol 0 - 200 mg/dL - 109   LDL (calc) 0 - 99 mg/dL - 46   HDL-C >40 mg/dL - 28   Trlycerides <150 mg/dL - 173   Hemoglobin A1c 4.8 - 5.6 % 6.1  -     Exercise Target Goals: Exercise Program Goal: Individual exercise prescription set using results from initial 6 min walk test and THRR while considering  patient's activity barriers and safety.   Exercise Prescription Goal: Initial exercise prescription builds to 30-45 minutes a day of aerobic activity, 2-3 days per week.  Home exercise guidelines will be given to patient during program as part of exercise prescription that the participant will acknowledge.   Education: Aerobic Exercise: - Group verbal and visual presentation on the components of exercise prescription. Introduces F.I.T.T principle from ACSM for exercise prescriptions.  Reviews F.I.T.T. principles of aerobic exercise including progression. Written material given at graduation.   Education: Resistance Exercise: - Group verbal and visual presentation on the components of exercise prescription. Introduces F.I.T.T principle from ACSM for exercise prescriptions  Reviews F.I.T.T. principles of resistance exercise including progression. Written material given at graduation.    Education: Exercise & Equipment Safety: - Individual verbal instruction and demonstration of equipment use and safety with use of the equipment. Flowsheet Row Cardiac Rehab from 04/28/2022 in [facility] Cardiac and Pulmonary Rehab  Date 04/28/22  Educator [facility]  Instruction Review Code 1- Verbalizes Understanding  Education: Exercise Physiology & General Exercise Guidelines: - Group verbal and written instruction with models to review the exercise physiology of the cardiovascular system and associated critical values. Provides general exercise guidelines with specific guidelines to those with heart or lung disease.    Education: Flexibility, Balance, Mind/Body  Relaxation: - Group verbal and visual presentation with interactive activity on the components of exercise prescription. Introduces F.I.T.T principle from ACSM for exercise prescriptions. Reviews F.I.T.T. principles of flexibility and balance exercise training including progression. Also discusses the mind body connection.  Reviews various relaxation techniques to help reduce and manage stress (i.e. Deep breathing, progressive muscle relaxation, and visualization). Balance handout provided to take home. Written material given at graduation.   Activity Barriers & Risk Stratification:  Activity Barriers & Cardiac Risk Stratification - 04/28/22 1251       Activity Barriers & Cardiac Risk Stratification   Activity Barriers Back Problems;Balance Concerns;Deconditioning   chronic back pain   Cardiac Risk Stratification Moderate             6 Minute Walk:  6 Minute Walk     Row Name 04/28/22 1250         6 Minute Walk   Phase Initial     Distance 1300 feet     Walk Time 6 minutes     # of Rest Breaks 0     MPH 2.46     METS 2.73     RPE 7     VO2 Peak 9.57     Symptoms No     Resting HR 77 bpm     Resting BP 134/62     Resting Oxygen Saturation  97 %     Exercise Oxygen Saturation  during 6 min walk 98 %     Max Ex. HR 90 bpm     Max Ex. BP 126/74     2 Minute Post BP 118/60              Oxygen Initial Assessment:   Oxygen Re-Evaluation:   Oxygen Discharge (Final Oxygen Re-Evaluation):   Initial Exercise Prescription:  Initial Exercise Prescription - 04/28/22 1200       Date of Initial Exercise RX and Referring Provider   Date 04/28/22    Referring Provider End, Harrell Gave MD      Oxygen   Maintain Oxygen Saturation 88% or higher      Treadmill   MPH 2.3    Grade 1    Minutes 15    METs 3.12      NuStep   Level 3    SPM 80    Minutes 15    METs 2      Recumbant Elliptical   Level 1    RPM 50    Minutes 15    METs 2      Elliptical    Level 1    Speed 2.5    Minutes 15    METs 2      REL-XR   Level 2    Speed 50    Minutes 15    METs 2      Track   Laps 35    Minutes 15    METs 2.9      Prescription Details   Frequency (times per week) 3    Duration Progress to 30 minutes of continuous aerobic without signs/symptoms of physical distress      Intensity   THRR 40-80% of Max Heartrate 105-134  Ratings of Perceived Exertion 11-13    Perceived Dyspnea 0-4      Progression   Progression Continue to progress workloads to maintain intensity without signs/symptoms of physical distress.      Resistance Training   Training Prescription Yes    Weight 5 lb    Reps 10-15             Perform Capillary Blood Glucose checks as needed.  Exercise Prescription Changes:   Exercise Prescription Changes     Row Name 04/28/22 1200             Response to Exercise   Blood Pressure (Admit) 134/62       Blood Pressure (Exercise) 126/74       Blood Pressure (Exit) 118/60       Heart Rate (Admit) 77 bpm       Heart Rate (Exercise) 90 bpm       Heart Rate (Exit) 68 bpm       Oxygen Saturation (Admit) 97 %       Oxygen Saturation (Exercise) 98 %       Rating of Perceived Exertion (Exercise) 7       Symptoms none       Comments walk test results                Exercise Comments:   Exercise Goals and Review:   Exercise Goals     Row Name 04/28/22 1257             Exercise Goals   Increase Physical Activity Yes       Intervention Provide advice, education, support and counseling about physical activity/exercise needs.;Develop an individualized exercise prescription for aerobic and resistive training based on initial evaluation findings, risk stratification, comorbidities and participant's personal goals.       Expected Outcomes Short Term: Attend rehab on a regular basis to increase amount of physical activity.;Long Term: Add in home exercise to make exercise part of routine and to increase  amount of physical activity.;Long Term: Exercising regularly at least 3-5 days a week.       Increase Strength and Stamina Yes       Intervention Provide advice, education, support and counseling about physical activity/exercise needs.;Develop an individualized exercise prescription for aerobic and resistive training based on initial evaluation findings, risk stratification, comorbidities and participant's personal goals.       Expected Outcomes Short Term: Increase workloads from initial exercise prescription for resistance, speed, and METs.;Short Term: Perform resistance training exercises routinely during rehab and add in resistance training at home;Long Term: Improve cardiorespiratory fitness, muscular endurance and strength as measured by increased METs and functional capacity (6MWT)       Able to understand and use rate of perceived exertion (RPE) scale Yes       Intervention Provide education and explanation on how to use RPE scale       Expected Outcomes Short Term: Able to use RPE daily in rehab to express subjective intensity level;Long Term:  Able to use RPE to guide intensity level when exercising independently       Able to understand and use Dyspnea scale Yes       Intervention Provide education and explanation on how to use Dyspnea scale       Expected Outcomes Short Term: Able to use Dyspnea scale daily in rehab to express subjective sense of shortness of breath during exertion;Long Term: Able to use Dyspnea scale to guide intensity  level when exercising independently       Knowledge and understanding of Target Heart Rate Range (THRR) Yes       Intervention Provide education and explanation of THRR including how the numbers were predicted and where they are located for reference       Expected Outcomes Short Term: Able to state/look up THRR;Short Term: Able to use daily as guideline for intensity in rehab;Long Term: Able to use THRR to govern intensity when exercising independently        Able to check pulse independently Yes       Intervention Provide education and demonstration on how to check pulse in carotid and radial arteries.;Review the importance of being able to check your own pulse for safety during independent exercise       Expected Outcomes Short Term: Able to explain why pulse checking is important during independent exercise;Long Term: Able to check pulse independently and accurately       Understanding of Exercise Prescription Yes       Intervention Provide education, explanation, and written materials on patient's individual exercise prescription       Expected Outcomes Short Term: Able to explain program exercise prescription;Long Term: Able to explain home exercise prescription to exercise independently                Exercise Goals Re-Evaluation :   Discharge Exercise Prescription (Final Exercise Prescription Changes):  Exercise Prescription Changes - 04/28/22 1200       Response to Exercise   Blood Pressure (Admit) 134/62    Blood Pressure (Exercise) 126/74    Blood Pressure (Exit) 118/60    Heart Rate (Admit) 77 bpm    Heart Rate (Exercise) 90 bpm    Heart Rate (Exit) 68 bpm    Oxygen Saturation (Admit) 97 %    Oxygen Saturation (Exercise) 98 %    Rating of Perceived Exertion (Exercise) 7    Symptoms none    Comments walk test results             Nutrition:  Target Goals: Understanding of nutrition guidelines, daily intake of sodium '1500mg'$ , cholesterol '200mg'$ , calories 30% from fat and 7% or less from saturated fats, daily to have 5 or more servings of fruits and vegetables.  Education: All About Nutrition: -Group instruction provided by verbal, written material, interactive activities, discussions, models, and posters to present general guidelines for heart healthy nutrition including fat, fiber, MyPlate, the role of sodium in heart healthy nutrition, utilization of the nutrition label, and utilization of this knowledge for meal  planning. Follow up email sent as well. Written material given at graduation. Flowsheet Row Cardiac Rehab from 04/28/2022 in [facility] Cardiac and Pulmonary Rehab  Education need identified 04/28/22       Biometrics:  Pre Biometrics - 04/28/22 1257       Pre Biometrics   Height 5' 6.4" (1.687 m)    Weight 163 lb 6.4 oz (74.1 kg)    Waist Circumference 37.5 inches    Hip Circumference 36 inches    Waist to Hip Ratio 1.04 %    BMI (Calculated) 26.04    Single Leg Stand 4.7 seconds              Nutrition Therapy Plan and Nutrition Goals:  Nutrition Therapy & Goals - 04/28/22 1258       Intervention Plan   Intervention Prescribe, educate and counsel regarding individualized specific dietary modifications aiming towards targeted core components such as weight,  hypertension, lipid management, diabetes, heart failure and other comorbidities.    Expected Outcomes Short Term Goal: Understand basic principles of dietary content, such as calories, fat, sodium, cholesterol and nutrients.;Short Term Goal: A plan has been developed with personal nutrition goals set during dietitian appointment.;Long Term Goal: Adherence to prescribed nutrition plan.             Nutrition Assessments:  MEDIFICTS Score Key: ?70 Need to make dietary changes  40-70 Heart Healthy Diet ? 40 Therapeutic Level Cholesterol Diet  Flowsheet Row Cardiac Rehab from 04/28/2022 in [facility] Cardiac and Pulmonary Rehab  Picture Your Plate Total Score on Admission 80      Picture Your Plate Scores: <74 Unhealthy dietary pattern with much room for improvement. 41-50 Dietary pattern unlikely to meet recommendations for good health and room for improvement. 51-60 More healthful dietary pattern, with some room for improvement.  >60 Healthy dietary pattern, although there may be some specific behaviors that could be improved.    Nutrition Goals Re-Evaluation:   Nutrition Goals Discharge (Final Nutrition Goals  Re-Evaluation):   Psychosocial: Target Goals: Acknowledge presence or absence of significant depression and/or stress, maximize coping skills, provide positive support system. Participant is able to verbalize types and ability to use techniques and skills needed for reducing stress and depression.   Education: Stress, Anxiety, and Depression - Group verbal and visual presentation to define topics covered.  Reviews how body is impacted by stress, anxiety, and depression.  Also discusses healthy ways to reduce stress and to treat/manage anxiety and depression.  Written material given at graduation.   Education: Sleep Hygiene -Provides group verbal and written instruction about how sleep can affect your health.  Define sleep hygiene, discuss sleep cycles and impact of sleep habits. Review good sleep hygiene tips.    Initial Review & Psychosocial Screening:  Initial Psych Review & Screening - 03/29/22 0846       Initial Review   Current issues with Current Sleep Concerns    Comments Pt reports that he is not sleeping as well since he is not as active; he stopped taking his statin as it was making it even more difficult to sleep- he let his doctor know. He reports working around the house and the yard - and he helps his son with his job. He is also watching his grandchildren, gardening, and fishing; he is enjoying his retirment and is excited to go fishing this September. He walks about 0.25 miles a day in the morning as he wakes up around [place]? Yes   he relies on his wife for support as well of his son and daughter who live nearby.     Barriers   Psychosocial barriers to participate in program Psychosocial barriers identified (see note);The patient should benefit from training in stress management and relaxation.      Screening Interventions   Interventions Encouraged to exercise;Provide feedback about the scores to participant;To provide support  and resources with identified psychosocial needs    Expected Outcomes Short Term goal: Utilizing psychosocial counselor, staff and physician to assist with identification of specific Stressors or current issues interfering with healing process. Setting desired goal for each stressor or current issue identified.;Long Term Goal: Stressors or current issues are controlled or eliminated.;Short Term goal: Identification and review with participant of any Quality of Life or Depression concerns found by scoring the questionnaire.;Long Term goal: The participant improves quality of Life and  PHQ9 Scores as seen by post scores and/or verbalization of changes             Quality of Life Scores:   Quality of Life - 04/28/22 1257       Quality of Life   Select Quality of Life      Quality of Life Scores   Health/Function Pre 28.4 %    Socioeconomic Pre 28.5 %    Psych/Spiritual Pre 30 %    Family Pre 24 %    GLOBAL Pre 28.11 %            Scores of 19 and below usually indicate a poorer quality of life in these areas.  A difference of  2-3 points is a clinically meaningful difference.  A difference of 2-3 points in the total score of the Quality of Life Index has been associated with significant improvement in overall quality of life, self-image, physical symptoms, and general health in studies assessing change in quality of life.  PHQ-9: Review Flowsheet       04/28/2022  Depression screen PHQ 2/9  Decreased Interest 0  Down, Depressed, Hopeless 0  PHQ - 2 Score 0  Altered sleeping 0  Tired, decreased energy 0  Change in appetite 0  Feeling bad or failure about yourself  0  Trouble concentrating 0  Moving slowly or fidgety/restless 0  Suicidal thoughts 0  PHQ-9 Score 0  Difficult doing work/chores Not difficult at all   Interpretation of Total Score  Total Score Depression Severity:  1-4 = Minimal depression, 5-9 = Mild depression, 10-14 = Moderate depression, 15-19 = Moderately  severe depression, 20-27 = Severe depression   Psychosocial Evaluation and Intervention:  Psychosocial Evaluation - 03/29/22 0859       Psychosocial Evaluation & Interventions   Interventions Stress management education;Relaxation education;Encouraged to exercise with the program and follow exercise prescription    Comments Pt reports that he is not sleeping as well since he is not as active; he stopped taking his statin as it was making it even more difficult to sleep- he let his doctor know. He reports working around the house and the yard - and he helps his son with his job. He is also watching his grandchildren, gardening, and fishing; he is enjoying his retirment and is excited to go fishing this September. He walks about 0.25 miles a day in the morning as he wakes up around 6am. He relies on his wife as well as his son and daughter who live close for support. He feels he is doing well since his heart event and he checks his BP daily; it is running around 110-120/60s.    Expected Outcomes ST: attend all scheduled exercise/education sessions, progress exercise prescription LT: continue to progress exercise prescription independently    Continue Psychosocial Services  Follow up required by staff             Psychosocial Re-Evaluation:   Psychosocial Discharge (Final Psychosocial Re-Evaluation):   Vocational Rehabilitation: Provide vocational rehab assistance to qualifying candidates.   Vocational Rehab Evaluation & Intervention:  Vocational Rehab - 03/29/22 5009       Initial Vocational Rehab Evaluation & Intervention   Assessment shows need for Vocational Rehabilitation No      Vocational Rehab Re-Evaulation   Comments Retired             Education: Education Goals: Education classes will be provided on a variety of topics geared toward better understanding of heart health  and risk factor modification. Participant will state understanding/return demonstration of topics  presented as noted by education test scores.  Learning Barriers/Preferences:  Learning Barriers/Preferences - 03/29/22 0908       Learning Barriers/Preferences   Learning Barriers None    Learning Preferences None             General Cardiac Education Topics:  AED/CPR: - Group verbal and written instruction with the use of models to demonstrate the basic use of the AED with the basic ABC's of resuscitation.   Anatomy and Cardiac Procedures: - Group verbal and visual presentation and models provide information about basic cardiac anatomy and function. Reviews the testing methods done to diagnose heart disease and the outcomes of the test results. Describes the treatment choices: Medical Management, Angioplasty, or Coronary Bypass Surgery for treating various heart conditions including Myocardial Infarction, Angina, Valve Disease, and Cardiac Arrhythmias.  Written material given at graduation.   Medication Safety: - Group verbal and visual instruction to review commonly prescribed medications for heart and lung disease. Reviews the medication, class of the drug, and side effects. Includes the steps to properly store meds and maintain the prescription regimen.  Written material given at graduation.   Intimacy: - Group verbal instruction through game format to discuss how heart and lung disease can affect sexual intimacy. Written material given at graduation..   Know Your Numbers and Heart Failure: - Group verbal and visual instruction to discuss disease risk factors for cardiac and pulmonary disease and treatment options.  Reviews associated critical values for Overweight/Obesity, Hypertension, Cholesterol, and Diabetes.  Discusses basics of heart failure: signs/symptoms and treatments.  Introduces Heart Failure Zone chart for action plan for heart failure.  Written material given at graduation.   Infection Prevention: - Provides verbal and written material to individual with  discussion of infection control including proper hand washing and proper equipment cleaning during exercise session. Flowsheet Row Cardiac Rehab from 04/28/2022 in [facility] Cardiac and Pulmonary Rehab  Date 04/28/22  Educator [facility]  Instruction Review Code 1- Verbalizes Understanding       Falls Prevention: - Provides verbal and written material to individual with discussion of falls prevention and safety. Flowsheet Row Cardiac Rehab from 04/28/2022 in [facility] Cardiac and Pulmonary Rehab  Education need identified 03/29/22  Date 03/29/22  Educator [place]  Instruction Review Code 1- Verbalizes Understanding       Other: -Provides group and verbal instruction on various topics (see comments)   Knowledge Questionnaire Score:  Knowledge Questionnaire Score - 04/28/22 1258       Knowledge Questionnaire Score   Pre Score 25/26             Core Components/Risk Factors/Patient Goals at Admission:  Personal Goals and Risk Factors at Admission - 04/28/22 1259       Core Components/Risk Factors/Patient Goals on Admission    Weight Management Yes;Weight Loss    Intervention Weight Management: Develop a combined nutrition and exercise program designed to reach desired caloric intake, while maintaining appropriate intake of nutrient and fiber, sodium and fats, and appropriate energy expenditure required for the weight goal.;Weight Management: Provide education and appropriate resources to help participant work on and attain dietary goals.    Admit Weight 163 lb 6.4 oz (74.1 kg)    Goal Weight: Short Term 155 lb (70.3 kg)    Goal Weight: Long Term 155 lb (70.3 kg)    Expected Outcomes Short Term: Continue to assess and modify interventions until short term weight  is achieved;Long Term: Adherence to nutrition and physical activity/exercise program aimed toward attainment of established weight goal;Understanding recommendations for meals to include 15-35% energy as protein, 25-35% energy from fat,  35-60% energy from carbohydrates, less than '200mg'$  of dietary cholesterol, 20-35 gm of total fiber daily;Understanding of distribution of calorie intake throughout the day with the consumption of 4-5 meals/snacks;Weight Loss: Understanding of general recommendations for a balanced deficit meal plan, which promotes 1-2 lb weight loss per week and includes a negative energy balance of [phone] kcal/d    Diabetes Yes   diet controlled   Intervention Provide education about signs/symptoms and action to take for hypo/hyperglycemia.;Provide education about proper nutrition, including hydration, and aerobic/resistive exercise prescription along with prescribed medications to achieve blood glucose in normal ranges: Fasting glucose 65-99 mg/dL    Expected Outcomes Short Term: Participant verbalizes understanding of the signs/symptoms and immediate care of hyper/hypoglycemia, proper foot care and importance of medication, aerobic/resistive exercise and nutrition plan for blood glucose control.;Long Term: Attainment of HbA1C < 7%.    Hypertension Yes    Intervention Provide education on lifestyle modifcations including regular physical activity/exercise, weight management, moderate sodium restriction and increased consumption of fresh fruit, vegetables, and low fat dairy, alcohol moderation, and smoking cessation.;Monitor prescription use compliance.    Expected Outcomes Short Term: Continued assessment and intervention until BP is < 140/55m HG in hypertensive participants. < 130/857mHG in hypertensive participants with diabetes, heart failure or chronic kidney disease.;Long Term: Maintenance of blood pressure at goal levels.    Lipids Yes    Intervention Provide education and support for participant on nutrition & aerobic/resistive exercise along with prescribed medications to achieve LDL '70mg'$ , HDL >'40mg'$ .    Expected Outcomes Short Term: Participant states understanding of desired cholesterol values and is  compliant with medications prescribed. Participant is following exercise prescription and nutrition guidelines.;Long Term: Cholesterol controlled with medications as prescribed, with individualized exercise RX and with personalized nutrition plan. Value goals: LDL < '70mg'$ , HDL > 40 mg.             Education:Diabetes - Individual verbal and written instruction to review signs/symptoms of diabetes, desired ranges of glucose level fasting, after meals and with exercise. Acknowledge that pre and post exercise glucose checks will be done for 3 sessions at entry of program.   Core Components/Risk Factors/Patient Goals Review:    Core Components/Risk Factors/Patient Goals at Discharge (Final Review):    ITP Comments:  ITP Comments     Row Name 03/29/22 0906 04/28/22 1245         ITP Comments Virtual orientation call completed today. He has an appointment on Date: 04/04/22  for EP eval and gym Orientation.  [facility] . Completed 6MWT and gym orientation. Initial ITP created and sent for review to Dr. MaEmily FilbertMedical Director.               Comments: Initial ITP

## 2022-04-28 NOTE — Patient Instructions (Addendum)
Patient Instructions  Patient Details  Name: Jeremy Gilbert MRN: [id number] Date of Birth: 10/08/1950 Referring Provider:  Nelva Bush, MD  Below are your personal goals for exercise, nutrition, and risk factors. Our goal is to help you stay on track towards obtaining and maintaining these goals. We will be discussing your progress on these goals with you throughout the program.  Initial Exercise Prescription:  Initial Exercise Prescription - 04/28/22 1200       Date of Initial Exercise RX and Referring Provider   Date 04/28/22    Referring Provider End, Harrell Gave MD      Oxygen   Maintain Oxygen Saturation 88% or higher      Treadmill   MPH 2.3    Grade 1    Minutes 15    METs 3.12      NuStep   Level 3    SPM 80    Minutes 15    METs 2      Recumbant Elliptical   Level 1    RPM 50    Minutes 15    METs 2      Elliptical   Level 1    Speed 2.5    Minutes 15    METs 2      REL-XR   Level 2    Speed 50    Minutes 15    METs 2      Track   Laps 35    Minutes 15    METs 2.9      Prescription Details   Frequency (times per week) 3    Duration Progress to 30 minutes of continuous aerobic without signs/symptoms of physical distress      Intensity   THRR 40-80% of Max Heartrate 105-134    Ratings of Perceived Exertion 11-13    Perceived Dyspnea 0-4      Progression   Progression Continue to progress workloads to maintain intensity without signs/symptoms of physical distress.      Resistance Training   Training Prescription Yes    Weight 5 lb    Reps 10-15             Exercise Goals: Frequency: Be able to perform aerobic exercise two to three times per week in program working toward 2-5 days per week of home exercise.  Intensity: Work with a perceived exertion of 11 (fairly light) - 15 (hard) while following your exercise prescription.  We will make changes to your prescription with you as you progress through the program.   Duration:  Be able to do 30 to 45 minutes of continuous aerobic exercise in addition to a 5 minute warm-up and a 5 minute cool-down routine.   Nutrition Goals: Your personal nutrition goals will be established when you do your nutrition analysis with the dietician.  The following are general nutrition guidelines to follow: Cholesterol < '200mg'$ /day Sodium < '1500mg'$ /day Fiber: Men over 50 yrs - 30 grams per day  Personal Goals:  Personal Goals and Risk Factors at Admission - 04/28/22 1259       Core Components/Risk Factors/Patient Goals on Admission    Weight Management Yes;Weight Loss    Intervention Weight Management: Develop a combined nutrition and exercise program designed to reach desired caloric intake, while maintaining appropriate intake of nutrient and fiber, sodium and fats, and appropriate energy expenditure required for the weight goal.;Weight Management: Provide education and appropriate resources to help participant work on and attain dietary goals.    Admit  Weight 163 lb 6.4 oz (74.1 kg)    Goal Weight: Short Term 155 lb (70.3 kg)    Goal Weight: Long Term 155 lb (70.3 kg)    Expected Outcomes Short Term: Continue to assess and modify interventions until short term weight is achieved;Long Term: Adherence to nutrition and physical activity/exercise program aimed toward attainment of established weight goal;Understanding recommendations for meals to include 15-35% energy as protein, 25-35% energy from fat, 35-60% energy from carbohydrates, less than '200mg'$  of dietary cholesterol, 20-35 gm of total fiber daily;Understanding of distribution of calorie intake throughout the day with the consumption of 4-5 meals/snacks;Weight Loss: Understanding of general recommendations for a balanced deficit meal plan, which promotes 1-2 lb weight loss per week and includes a negative energy balance of [phone] kcal/d    Diabetes Yes   diet controlled   Intervention Provide education about signs/symptoms and  action to take for hypo/hyperglycemia.;Provide education about proper nutrition, including hydration, and aerobic/resistive exercise prescription along with prescribed medications to achieve blood glucose in normal ranges: Fasting glucose 65-99 mg/dL    Expected Outcomes Short Term: Participant verbalizes understanding of the signs/symptoms and immediate care of hyper/hypoglycemia, proper foot care and importance of medication, aerobic/resistive exercise and nutrition plan for blood glucose control.;Long Term: Attainment of HbA1C < 7%.    Hypertension Yes    Intervention Provide education on lifestyle modifcations including regular physical activity/exercise, weight management, moderate sodium restriction and increased consumption of fresh fruit, vegetables, and low fat dairy, alcohol moderation, and smoking cessation.;Monitor prescription use compliance.    Expected Outcomes Short Term: Continued assessment and intervention until BP is < 140/60m HG in hypertensive participants. < 130/822mHG in hypertensive participants with diabetes, heart failure or chronic kidney disease.;Long Term: Maintenance of blood pressure at goal levels.    Lipids Yes    Intervention Provide education and support for participant on nutrition & aerobic/resistive exercise along with prescribed medications to achieve LDL '70mg'$ , HDL >'40mg'$ .    Expected Outcomes Short Term: Participant states understanding of desired cholesterol values and is compliant with medications prescribed. Participant is following exercise prescription and nutrition guidelines.;Long Term: Cholesterol controlled with medications as prescribed, with individualized exercise RX and with personalized nutrition plan. Value goals: LDL < '70mg'$ , HDL > 40 mg.             Tobacco Use Initial Evaluation: Social History   Tobacco Use  Smoking Status Former   Types: Cigarettes   Start date: 01/07/1968   Quit date: 01/07/2000   Years since quitting: 22.3  Smokeless  Tobacco Never    Exercise Goals and Review:  Exercise Goals     Row Name 04/28/22 1257             Exercise Goals   Increase Physical Activity Yes       Intervention Provide advice, education, support and counseling about physical activity/exercise needs.;Develop an individualized exercise prescription for aerobic and resistive training based on initial evaluation findings, risk stratification, comorbidities and participant's personal goals.       Expected Outcomes Short Term: Attend rehab on a regular basis to increase amount of physical activity.;Long Term: Add in home exercise to make exercise part of routine and to increase amount of physical activity.;Long Term: Exercising regularly at least 3-5 days a week.       Increase Strength and Stamina Yes       Intervention Provide advice, education, support and counseling about physical activity/exercise needs.;Develop an individualized exercise prescription for aerobic and  resistive training based on initial evaluation findings, risk stratification, comorbidities and participant's personal goals.       Expected Outcomes Short Term: Increase workloads from initial exercise prescription for resistance, speed, and METs.;Short Term: Perform resistance training exercises routinely during rehab and add in resistance training at home;Long Term: Improve cardiorespiratory fitness, muscular endurance and strength as measured by increased METs and functional capacity (6MWT)       Able to understand and use rate of perceived exertion (RPE) scale Yes       Intervention Provide education and explanation on how to use RPE scale       Expected Outcomes Short Term: Able to use RPE daily in rehab to express subjective intensity level;Long Term:  Able to use RPE to guide intensity level when exercising independently       Able to understand and use Dyspnea scale Yes       Intervention Provide education and explanation on how to use Dyspnea scale       Expected  Outcomes Short Term: Able to use Dyspnea scale daily in rehab to express subjective sense of shortness of breath during exertion;Long Term: Able to use Dyspnea scale to guide intensity level when exercising independently       Knowledge and understanding of Target Heart Rate Range (THRR) Yes       Intervention Provide education and explanation of THRR including how the numbers were predicted and where they are located for reference       Expected Outcomes Short Term: Able to state/look up THRR;Short Term: Able to use daily as guideline for intensity in rehab;Long Term: Able to use THRR to govern intensity when exercising independently       Able to check pulse independently Yes       Intervention Provide education and demonstration on how to check pulse in carotid and radial arteries.;Review the importance of being able to check your own pulse for safety during independent exercise       Expected Outcomes Short Term: Able to explain why pulse checking is important during independent exercise;Long Term: Able to check pulse independently and accurately       Understanding of Exercise Prescription Yes       Intervention Provide education, explanation, and written materials on patient's individual exercise prescription       Expected Outcomes Short Term: Able to explain program exercise prescription;Long Term: Able to explain home exercise prescription to exercise independently                Copy of goals given to participant.

## 2022-05-02 ENCOUNTER — Encounter: Payer: Medicare Other | Admitting: *Deleted

## 2022-05-02 DIAGNOSIS — I214 Non-ST elevation (NSTEMI) myocardial infarction: Secondary | ICD-10-CM | POA: Diagnosis not present

## 2022-05-02 DIAGNOSIS — Z955 Presence of coronary angioplasty implant and graft: Secondary | ICD-10-CM

## 2022-05-02 NOTE — Progress Notes (Signed)
Daily Session Note  Patient Details  Name: Jeremy Gilbert MRN: [id number] Date of Birth: 04/15/1951 Referring Provider:   Flowsheet Row Cardiac Rehab from 04/28/2022 in [facility] Cardiac and Pulmonary Rehab  Referring Provider End, Harrell Gave MD       Encounter Date: 05/02/2022  Check In:  Session Check In - 05/02/22 1035       Check-In   Supervising physician immediately available to respond to emergencies See telemetry face sheet for immediately available ER MD    Location [facility]-Cardiac & Pulmonary Rehab    Staff Present Justin Mend, Jaci Carrel, BS, ACSM CEP, Exercise Physiologist;Susanne Bice, RN, BSN, CCRP;Other   Darlyne Russian RN, [place]   Virtual Visit No    Medication changes reported     No    Fall or balance concerns reported    No    Warm-up and Cool-down Performed on first and last piece of equipment    Resistance Training Performed Yes    VAD Patient? No    PAD/SET Patient? No      Pain Assessment   Currently in Pain? No/denies                Social History   Tobacco Use  Smoking Status Former   Types: Cigarettes   Start date: 01/07/1968   Quit date: 01/07/2000   Years since quitting: 22.3  Smokeless Tobacco Never    Goals Met:  Exercise tolerated well Personal goals reviewed No report of concerns or symptoms today Strength training completed today  Goals Unmet:  Not Applicable  Comments: First full day of exercise!  Patient was oriented to gym and equipment including functions, settings, policies, and procedures.  Patient's individual exercise prescription and treatment plan were reviewed.  All starting workloads were established based on the results of the 6 minute walk test done at initial orientation visit.  The plan for exercise progression was also introduced and progression will be customized based on patient's performance and goals.    Dr. Emily Filbert is Medical Director for [place].  Dr. Ottie Glazier is  Medical Director for [facility] Pulmonary Rehabilitation.

## 2022-05-05 ENCOUNTER — Encounter: Payer: Medicare Other | Admitting: *Deleted

## 2022-05-05 DIAGNOSIS — I214 Non-ST elevation (NSTEMI) myocardial infarction: Secondary | ICD-10-CM

## 2022-05-05 DIAGNOSIS — Z955 Presence of coronary angioplasty implant and graft: Secondary | ICD-10-CM

## 2022-05-05 NOTE — Progress Notes (Signed)
Daily Session Note  Patient Details  Name: Jeremy Gilbert MRN: [id number] Date of Birth: Nov 25, 1950 Referring Provider:   Flowsheet Row Cardiac Rehab from 04/28/2022 in [facility] Cardiac [facility] Pul[facility] Rehab  Referring Provider End, Harrell Gave MD       Encounter Date: 05/05/2022  Check In:  Session Check In - 05/05/22 0823       Check-In   Supervising physician immediately available to respond to emergencies See telemetry face sheet for immediately available ER MD    Location [facility]-Cardiac & Pulmonary Rehab    Staff Present Earlean Shawl, BS, ACSM CEP, Exercise Physiologist;Shamarcus Rosebud Poles, RN, [place]    Virtual Visit No    Medication changes reported     No    Fall or balance concerns reported    No    Warm-up and Cool-down Performed on first and last piece of equipment    Resistance Training Performed Yes    VAD Patient? No    PAD/SET Patient? No      Pain Assessment   Currently in Pain? No/denies                Social History   Tobacco Use  Smoking Status Former   Types: Cigarettes   Start date: 01/07/1968   Quit date: 01/07/2000   Years since quitting: 22.3  Smokeless Tobacco Never    Goals Met:  Independence with exercise equipment Exercise tolerated well No report of concerns or symptoms today Strength training completed today  Goals Unmet:  Not Applicable  Comments: Pt able to follow exercise prescription today without complaint.  Will continue to monitor for progression.    Dr. Emily Filbert is Medical Director for [place].  Dr. Ottie Glazier is Medical Director for [facility] Pulmonary Rehabilitation.

## 2022-05-06 ENCOUNTER — Encounter: Payer: Medicare Other | Admitting: *Deleted

## 2022-05-06 DIAGNOSIS — I214 Non-ST elevation (NSTEMI) myocardial infarction: Secondary | ICD-10-CM | POA: Diagnosis not present

## 2022-05-06 DIAGNOSIS — Z955 Presence of coronary angioplasty implant and graft: Secondary | ICD-10-CM

## 2022-05-06 NOTE — Progress Notes (Signed)
Daily Session Note  Patient Details  Name: Jeremy Gilbert MRN: [id number] Date of Birth: [birth year] Referring Provider:   Flowsheet Row Cardiac Rehab from 04/28/2022 in [facility] Cardiac and Pulmonary Rehab  Referring Provider End, Harrell Gave MD       Encounter Date: 05/06/2022  Check In:  Session Check In - 05/06/22 0829       Check-In   Supervising physician immediately available to respond to emergencies See telemetry face sheet for immediately available ER MD    Location [facility]-Cardiac & Pulmonary Rehab    Staff Present Heath Lark, RN, BSN, CCRP;St[place]man [place], [place]    Virtual Visit No    Medication changes reported     No    Fall or balance concerns reported    No    Warm-up and Cool-down Performed on first and last piece of equipment    Resistance Training Performed Yes    VAD Patient? No    PAD/SET Patient? No      Pain Assessment   Currently in Pain? No/denies                Social History   Tobacco Use  Smoking Status Former   Types: Cigarettes   Start date: 01/07/1968   Quit date: 01/07/2000   Years since quitting: 22.3  Smokeless Tobacco Never    Goals Met:  Independence with exercise equipment Exercise tolerated well No report of concerns or symptoms today  Goals Unmet:  Not Applicable  Comments: Pt able to follow exercise prescription today without complaint.  Will continue to monitor for progression.    Dr. Emily Filbert is Medical Director for [place].  Dr. Ottie Glazier is Medical Director for [facility] Pulmonary Rehabilitation.

## 2022-05-09 ENCOUNTER — Encounter: Payer: Medicare Other | Attending: Internal Medicine | Admitting: *Deleted

## 2022-05-09 DIAGNOSIS — I214 Non-ST elevation (NSTEMI) myocardial infarction: Secondary | ICD-10-CM

## 2022-05-09 DIAGNOSIS — I252 Old myocardial infarction: Secondary | ICD-10-CM | POA: Insufficient documentation

## 2022-05-09 DIAGNOSIS — Z955 Presence of coronary angioplasty implant and graft: Secondary | ICD-10-CM | POA: Diagnosis present

## 2022-05-09 DIAGNOSIS — Z48812 Encounter for surgical aftercare following surgery on the circulatory system: Secondary | ICD-10-CM | POA: Insufficient documentation

## 2022-05-09 NOTE — Progress Notes (Signed)
Daily Session Note  Patient Details  Name: Jeremy Gilbert MRN: [id number] Date of Birth: [birth year] Referring Provider:   Flowsheet Row Cardiac Rehab from 04/28/2022 in [facility] Cardiac and Pulmonary Rehab  Referring Provider End, Harrell Gave MD       Encounter Date: 05/09/2022  Check In:  Session Check In - 05/09/22 0838       Check-In   Supervising physician immediately available to respond to emergencies See telemetry face sheet for immediately available ER MD    Location [facility]-Cardiac & Pulmonary Rehab    Staff Present Earlean Shawl, BS, ACSM CEP, Exercise Physiologist;Olanda Rosebud Poles, RN, [place]    Virtual Visit No    Medication changes reported     No    Fall or balance concerns reported    No    Warm-up and Cool-down Performed on first and last piece of equipment    Resistance Training Performed Yes    VAD Patient? No    PAD/SET Patient? No      Pain Assessment   Currently in Pain? No/denies                Social History   Tobacco Use  Smoking Status Former   Types: Cigarettes   Start date: 01/07/1968   Quit date: 01/07/2000   Years since quitting: 22.3  Smokeless Tobacco Never    Goals Met:  Independence with exercise equipment Exercise tolerated well No report of concerns or symptoms today Strength training completed today  Goals Unmet:  Not Applicable  Comments: Pt able to follow exercise prescription today without complaint.  Will continue to monitor for progression.    Dr. Emily Filbert is Medical Director for [place].  Dr. Ottie Glazier is Medical Director for [facility] Pulmonary Rehabilitation.

## 2022-05-11 ENCOUNTER — Encounter: Payer: Self-pay | Admitting: *Deleted

## 2022-05-11 DIAGNOSIS — Z955 Presence of coronary angioplasty implant and graft: Secondary | ICD-10-CM

## 2022-05-11 DIAGNOSIS — I214 Non-ST elevation (NSTEMI) myocardial infarction: Secondary | ICD-10-CM

## 2022-05-11 NOTE — Progress Notes (Signed)
Cardiac Individual Treatment Plan  Patient Details  Name: Jeremy Gilbert MRN: [id number] Date of Birth: [birth year] Referring Provider:   Flowsheet Row Cardiac Rehab from 04/28/2022 in [facility] Cardiac and Pulmonary Rehab  Referring Provider End, Harrell Gave MD       Initial Encounter Date:  Flowsheet Row Cardiac Rehab from 04/28/2022 in [facility] Cardiac and Pulmonary Rehab  Date 04/28/22       Visit Diagnosis: NSTEMI (non-ST elevation myocardial infarction) [facility])  Status post coronary artery stent placement  Patient's Home Medications on Admission:  Current Outpatient Medications:    acetaminophen (TYLENOL) 500 MG tablet, Take 500 mg by mouth every 6 (six) hours as needed for mild pain, moderate pain, fever or headache., Disp: , Rfl:    losartan-hydrochlorothiazide (HYZAAR) 50-12.5 MG tablet, Take 1 tablet by mouth daily., Disp: , Rfl:    metoprolol succinate (TOPROL-XL) 100 MG 24 hr tablet, Take 1 tablet by mouth 2 (two) times daily., Disp: , Rfl:    nitroGLYCERIN (NITROSTAT) 0.4 MG SL tablet, Place 1 tablet (0.4 mg total) under the tongue every 5 (five) minutes as needed for chest pain., Disp: 25 tablet, Rfl: PRN   rosuvastatin (CRESTOR) 40 MG tablet, Take 1 tablet (40 mg total) by mouth daily. (Patient not taking: Reported on 03/29/2022), Disp: 90 tablet, Rfl: 3   ticagrelor (BRILINTA) 90 MG TABS tablet, Take 1 tablet (90 mg total) by mouth 2 (two) times daily., Disp: 60 tablet, Rfl: 11 No current facility-administered medications for this visit.  Facility-Administered Medications Ordered in Other Visits:    iohexol (OMNIPAQUE) 300 MG/ML solution, , , PRN, Callwood, Dwayne D, MD, 350 mL at 03/11/22 1610  Past Medical History: Past Medical History:  Diagnosis Date   Actinic keratosis    Basal cell carcinoma 2005   L alar crease   Tobacco abuse 01/27/2015    Tobacco Use: Social History   Tobacco Use  Smoking Status Former   Types: Cigarettes   Start date: 01/07/1968   Quit  date: 01/07/2000   Years since quitting: 22.3  Smokeless Tobacco Never    Labs: Review Flowsheet       Latest Ref Rng & Units 03/10/2022 03/11/2022  Labs for ITP Cardiac and Pulmonary Rehab  Cholestrol 0 - 200 mg/dL - 109   LDL (calc) 0 - 99 mg/dL - 46   HDL-C >40 mg/dL - 28   Trlycerides <150 mg/dL - 173   Hemoglobin A1c 4.8 - 5.6 % 6.1  -     Exercise Target Goals: Exercise Program Goal: Individual exercise prescription set using results from initial 6 min walk test and THRR while considering  patient's activity barriers and safety.   Exercise Prescription Goal: Initial exercise prescription builds to 30-45 minutes a day of aerobic activity, 2-3 days per week.  Home exercise guidelines will be given to patient during program as part of exercise prescription that the participant will acknowledge.   Education: Aerobic Exercise: - Group verbal and visual presentation on the components of exercise prescription. Introduces F.I.T.T principle from ACSM for exercise prescriptions.  Reviews F.I.T.T. principles of aerobic exercise including progression. Written material given at graduation.   Education: Resistance Exercise: - Group verbal and visual presentation on the components of exercise prescription. Introduces F.I.T.T principle from ACSM for exercise prescriptions  Reviews F.I.T.T. principles of resistance exercise including progression. Written material given at graduation.    Education: Exercise & Equipment Safety: - Individual verbal instruction and demonstration of equipment use and safety with use of the  equipment. Flowsheet Row Cardiac Rehab from 04/28/2022 in [facility] Cardiac and Pulmonary Rehab  Date 04/28/22  Educator [facility]  Instruction Review Code 1- Verbalizes Understanding       Education: Exercise Physiology & General Exercise Guidelines: - Group verbal and written instruction with models to review the exercise physiology of the cardiovascular system and associated critical  values. Provides general exercise guidelines with specific guidelines to those with heart or lung disease.    Education: Flexibility, Balance, Mind/Body Relaxation: - Group verbal and visual presentation with interactive activity on the components of exercise prescription. Introduces F.I.T.T principle from ACSM for exercise prescriptions. Reviews F.I.T.T. principles of flexibility and balance exercise training including progression. Also discusses the mind body connection.  Reviews various relaxation techniques to help reduce and manage stress (i.e. Deep breathing, progressive muscle relaxation, and visualization). Balance handout provided to take home. Written material given at graduation.   Activity Barriers & Risk Stratification:  Activity Barriers & Cardiac Risk Stratification - 04/28/22 1251       Activity Barriers & Cardiac Risk Stratification   Activity Barriers Back Problems;Balance Concerns;Deconditioning   chronic back pain   Cardiac Risk Stratification Moderate             6 Minute Walk:  6 Minute Walk     Row Name 04/28/22 1250         6 Minute Walk   Phase Initial     Distance 1300 feet     Walk Time 6 minutes     # of Rest Breaks 0     MPH 2.46     METS 2.73     RPE 7     VO2 Peak 9.57     Symptoms No     Resting HR 77 bpm     Resting BP 134/62     Resting Oxygen Saturation  97 %     Exercise Oxygen Saturation  during 6 min walk 98 %     Max Ex. HR 90 bpm     Max Ex. BP 126/74     2 Minute Post BP 118/60              Oxygen Initial Assessment:   Oxygen Re-Evaluation:   Oxygen Discharge (Final Oxygen Re-Evaluation):   Initial Exercise Prescription:  Initial Exercise Prescription - 04/28/22 1200       Date of Initial Exercise RX and Referring Provider   Date 04/28/22    Referring Provider End, Harrell Gave MD      Oxygen   Maintain Oxygen Saturation 88% or higher      Treadmill   MPH 2.3    Grade 1    Minutes 15    METs 3.12       NuStep   Level 3    SPM 80    Minutes 15    METs 2      Recumbant Elliptical   Level 1    RPM 50    Minutes 15    METs 2      Elliptical   Level 1    Speed 2.5    Minutes 15    METs 2      REL-XR   Level 2    Speed 50    Minutes 15    METs 2      Track   Laps 35    Minutes 15    METs 2.9      Prescription Details   Frequency (times per  week) 3    Duration Progress to 30 minutes of continuous aerobic without signs/symptoms of physical distress      Intensity   THRR 40-80% of Max Heartrate 105-134    Ratings of Perceived Exertion 11-13    Perceived Dyspnea 0-4      Progression   Progression Continue to progress workloads to maintain intensity without signs/symptoms of physical distress.      Resistance Training   Training Prescription Yes    Weight 5 lb    Reps 10-15             Perform Capillary Blood Glucose checks as needed.  Exercise Prescription Changes:   Exercise Prescription Changes     Row Name 04/28/22 1200             Response to Exercise   Blood Pressure (Admit) 134/62       Blood Pressure (Exercise) 126/74       Blood Pressure (Exit) 118/60       Heart Rate (Admit) 77 bpm       Heart Rate (Exercise) 90 bpm       Heart Rate (Exit) 68 bpm       Oxygen Saturation (Admit) 97 %       Oxygen Saturation (Exercise) 98 %       Rating of Perceived Exertion (Exercise) 7       Symptoms none       Comments walk test results                Exercise Comments:   Exercise Comments     Row Name 05/02/22 1035           Exercise Comments First full day of exercise!  Patient was oriented to gym and equipment including functions, settings, policies, and procedures.  Patient's individual exercise prescription and treatment plan were reviewed.  All starting workloads were established based on the results of the 6 minute walk test done at initial orientation visit.  The plan for exercise progression was also introduced and progression  will be customized based on patient's performance and goals.                Exercise Goals and Review:   Exercise Goals     Row Name 04/28/22 1257             Exercise Goals   Increase Physical Activity Yes       Intervention Provide advice, education, support and counseling about physical activity/exercise needs.;Develop an individualized exercise prescription for aerobic and resistive training based on initial evaluation findings, risk stratification, comorbidities and participant's personal goals.       Expected Outcomes Short Term: Attend rehab on a regular basis to increase amount of physical activity.;Long Term: Add in home exercise to make exercise part of routine and to increase amount of physical activity.;Long Term: Exercising regularly at least 3-5 days a week.       Increase Strength and Stamina Yes       Intervention Provide advice, education, support and counseling about physical activity/exercise needs.;Develop an individualized exercise prescription for aerobic and resistive training based on initial evaluation findings, risk stratification, comorbidities and participant's personal goals.       Expected Outcomes Short Term: Increase workloads from initial exercise prescription for resistance, speed, and METs.;Short Term: Perform resistance training exercises routinely during rehab and add in resistance training at home;Long Term: Improve cardiorespiratory fitness, muscular endurance and strength as measured by increased METs  and functional capacity (6MWT)       Able to understand and use rate of perceived exertion (RPE) scale Yes       Intervention Provide education and explanation on how to use RPE scale       Expected Outcomes Short Term: Able to use RPE daily in rehab to express subjective intensity level;Long Term:  Able to use RPE to guide intensity level when exercising independently       Able to understand and use Dyspnea scale Yes       Intervention Provide  education and explanation on how to use Dyspnea scale       Expected Outcomes Short Term: Able to use Dyspnea scale daily in rehab to express subjective sense of shortness of breath during exertion;Long Term: Able to use Dyspnea scale to guide intensity level when exercising independently       Knowledge and understanding of Target Heart Rate Range (THRR) Yes       Intervention Provide education and explanation of THRR including how the numbers were predicted and where they are located for reference       Expected Outcomes Short Term: Able to state/look up THRR;Short Term: Able to use daily as guideline for intensity in rehab;Long Term: Able to use THRR to govern intensity when exercising independently       Able to check pulse independently Yes       Intervention Provide education and demonstration on how to check pulse in carotid and radial arteries.;Review the importance of being able to check your own pulse for safety during independent exercise       Expected Outcomes Short Term: Able to explain why pulse checking is important during independent exercise;Long Term: Able to check pulse independently and accurately       Understanding of Exercise Prescription Yes       Intervention Provide education, explanation, and written materials on patient's individual exercise prescription       Expected Outcomes Short Term: Able to explain program exercise prescription;Long Term: Able to explain home exercise prescription to exercise independently                Exercise Goals Re-Evaluation :  Exercise Goals Re-Evaluation     Row Name 05/02/22 1036             Exercise Goal Re-Evaluation   Exercise Goals Review Increase Physical Activity;Increase Strength and Stamina;Able to understand and use rate of perceived exertion (RPE) scale;Able to understand and use Dyspnea scale;Knowledge and understanding of Target Heart Rate Range (THRR);Understanding of Exercise Prescription       Comments  Reviewed RPE and dyspnea scales, THR and program prescription with pt today.  Pt voiced understanding and was given a copy of goals to take home.       Expected Outcomes Short: Use RPE daily to regulate intensity. Long: Follow program prescription in THR.                Discharge Exercise Prescription (Final Exercise Prescription Changes):  Exercise Prescription Changes - 04/28/22 1200       Response to Exercise   Blood Pressure (Admit) 134/62    Blood Pressure (Exercise) 126/74    Blood Pressure (Exit) 118/60    Heart Rate (Admit) 77 bpm    Heart Rate (Exercise) 90 bpm    Heart Rate (Exit) 68 bpm    Oxygen Saturation (Admit) 97 %    Oxygen Saturation (Exercise) 98 %    Rating of  Perceived Exertion (Exercise) 7    Symptoms none    Comments walk test results             Nutrition:  Target Goals: Understanding of nutrition guidelines, daily intake of sodium '1500mg'$ , cholesterol '200mg'$ , calories 30% from fat and 7% or less from saturated fats, daily to have 5 or more servings of fruits and vegetables.  Education: All About Nutrition: -Group instruction provided by verbal, written material, interactive activities, discussions, models, and posters to present general guidelines for heart healthy nutrition including fat, fiber, MyPlate, the role of sodium in heart healthy nutrition, utilization of the nutrition label, and utilization of this knowledge for meal planning. Follow up email sent as well. Written material given at graduation. Flowsheet Row Cardiac Rehab from 04/28/2022 in [facility] Cardiac and Pulmonary Rehab  Education need identified 04/28/22       Biometrics:  Pre Biometrics - 04/28/22 1257       Pre Biometrics   Height 5' 6.4" (1.687 m)    Weight 163 lb 6.4 oz (74.1 kg)    Waist Circumference 37.5 inches    Hip Circumference 36 inches    Waist to Hip Ratio 1.04 %    BMI (Calculated) 26.04    Single Leg Stand 4.7 seconds              Nutrition Therapy  Plan and Nutrition Goals:  Nutrition Therapy & Goals - 05/09/22 0835       Nutrition Therapy   RD appointment deferred Yes   Pt would not like to meet with RD at this time. Will continue to follow up.     Personal Nutrition Goals   Nutrition Goal Pt would not like to meet with RD at this time. Will continue to follow up.             Nutrition Assessments:  MEDIFICTS Score Key: ?70 Need to make dietary changes  40-70 Heart Healthy Diet ? 40 Therapeutic Level Cholesterol Diet  Flowsheet Row Cardiac Rehab from 04/28/2022 in [facility] Cardiac and Pulmonary Rehab  Picture Your Plate Total Score on Admission 80      Picture Your Plate Scores: <09 Unhealthy dietary pattern with much room for improvement. 41-50 Dietary pattern unlikely to meet recommendations for good health and room for improvement. 51-60 More healthful dietary pattern, with some room for improvement.  >60 Healthy dietary pattern, although there may be some specific behaviors that could be improved.    Nutrition Goals Re-Evaluation:   Nutrition Goals Discharge (Final Nutrition Goals Re-Evaluation):   Psychosocial: Target Goals: Acknowledge presence or absence of significant depression and/or stress, maximize coping skills, provide positive support system. Participant is able to verbalize types and ability to use techniques and skills needed for reducing stress and depression.   Education: Stress, Anxiety, and Depression - Group verbal and visual presentation to define topics covered.  Reviews how body is impacted by stress, anxiety, and depression.  Also discusses healthy ways to reduce stress and to treat/manage anxiety and depression.  Written material given at graduation.   Education: Sleep Hygiene -Provides group verbal and written instruction about how sleep can affect your health.  Define sleep hygiene, discuss sleep cycles and impact of sleep habits. Review good sleep hygiene tips.    Initial Review &  Psychosocial Screening:  Initial Psych Review & Screening - 03/29/22 0846       Initial Review   Current issues with Current Sleep Concerns    Comments Pt reports that  he is not sleeping as well since he is not as active; he stopped taking his statin as it was making it even more difficult to sleep- he let his doctor know. He reports working around the house and the yard - and he helps his son with his job. He is also watching his grandchildren, gardening, and fishing; he is enjoying his retirment and is excited to go fishing this September. He walks about 0.25 miles a day in the morning as he wakes up around [place]? Yes   he relies on his wife for support as well of his son and daughter who live nearby.     Barriers   Psychosocial barriers to participate in program Psychosocial barriers identified (see note);The patient should benefit from training in stress management and relaxation.      Screening Interventions   Interventions Encouraged to exercise;Provide feedback about the scores to participant;To provide support and resources with identified psychosocial needs    Expected Outcomes Short Term goal: Utilizing psychosocial counselor, staff and physician to assist with identification of specific Stressors or current issues interfering with healing process. Setting desired goal for each stressor or current issue identified.;Long Term Goal: Stressors or current issues are controlled or eliminated.;Short Term goal: Identification and review with participant of any Quality of Life or Depression concerns found by scoring the questionnaire.;Long Term goal: The participant improves quality of Life and PHQ9 Scores as seen by post scores and/or verbalization of changes             Quality of Life Scores:   Quality of Life - 04/28/22 1257       Quality of Life   Select Quality of Life      Quality of Life Scores   Health/Function Pre 28.4 %     Socioeconomic Pre 28.5 %    Psych/Spiritual Pre 30 %    Family Pre 24 %    GLOBAL Pre 28.11 %            Scores of 19 and below usually indicate a poorer quality of life in these areas.  A difference of  2-3 points is a clinically meaningful difference.  A difference of 2-3 points in the total score of the Quality of Life Index has been associated with significant improvement in overall quality of life, self-image, physical symptoms, and general health in studies assessing change in quality of life.  PHQ-9: Review Flowsheet       04/28/2022  Depression screen PHQ 2/9  Decreased Interest 0  Down, Depressed, Hopeless 0  PHQ - 2 Score 0  Altered sleeping 0  Tired, decreased energy 0  Change in appetite 0  Feeling bad or failure about yourself  0  Trouble concentrating 0  Moving slowly or fidgety/restless 0  Suicidal thoughts 0  PHQ-9 Score 0  Difficult doing work/chores Not difficult at all   Interpretation of Total Score  Total Score Depression Severity:  1-4 = Minimal depression, 5-9 = Mild depression, 10-14 = Moderate depression, 15-19 = Moderately severe depression, 20-27 = Severe depression   Psychosocial Evaluation and Intervention:  Psychosocial Evaluation - 03/29/22 0859       Psychosocial Evaluation & Interventions   Interventions Stress management education;Relaxation education;Encouraged to exercise with the program and follow exercise prescription    Comments Pt reports that he is not sleeping as well since he is not as active; he stopped taking his statin  as it was making it even more difficult to sleep- he let his doctor know. He reports working around the house and the yard - and he helps his son with his job. He is also watching his grandchildren, gardening, and fishing; he is enjoying his retirment and is excited to go fishing this September. He walks about 0.25 miles a day in the morning as he wakes up around 6am. He relies on his wife as well as his son and  daughter who live close for support. He feels he is doing well since his heart event and he checks his BP daily; it is running around 110-120/60s.    Expected Outcomes ST: attend all scheduled exercise/education sessions, progress exercise prescription LT: continue to progress exercise prescription independently    Continue Psychosocial Services  Follow up required by staff             Psychosocial Re-Evaluation:   Psychosocial Discharge (Final Psychosocial Re-Evaluation):   Vocational Rehabilitation: Provide vocational rehab assistance to qualifying candidates.   Vocational Rehab Evaluation & Intervention:  Vocational Rehab - 03/29/22 2263       Initial Vocational Rehab Evaluation & Intervention   Assessment shows need for Vocational Rehabilitation No      Vocational Rehab Re-Evaulation   Comments Retired             Education: Education Goals: Education classes will be provided on a variety of topics geared toward better understanding of heart health and risk factor modification. Participant will state understanding/return demonstration of topics presented as noted by education test scores.  Learning Barriers/Preferences:  Learning Barriers/Preferences - 03/29/22 0908       Learning Barriers/Preferences   Learning Barriers None    Learning Preferences None             General Cardiac Education Topics:  AED/CPR: - Group verbal and written instruction with the use of models to demonstrate the basic use of the AED with the basic ABC's of resuscitation.   Anatomy and Cardiac Procedures: - Group verbal and visual presentation and models provide information about basic cardiac anatomy and function. Reviews the testing methods done to diagnose heart disease and the outcomes of the test results. Describes the treatment choices: Medical Management, Angioplasty, or Coronary Bypass Surgery for treating various heart conditions including Myocardial Infarction, Angina,  Valve Disease, and Cardiac Arrhythmias.  Written material given at graduation.   Medication Safety: - Group verbal and visual instruction to review commonly prescribed medications for heart and lung disease. Reviews the medication, class of the drug, and side effects. Includes the steps to properly store meds and maintain the prescription regimen.  Written material given at graduation.   Intimacy: - Group verbal instruction through game format to discuss how heart and lung disease can affect sexual intimacy. Written material given at graduation..   Know Your Numbers and Heart Failure: - Group verbal and visual instruction to discuss disease risk factors for cardiac and pulmonary disease and treatment options.  Reviews associated critical values for Overweight/Obesity, Hypertension, Cholesterol, and Diabetes.  Discusses basics of heart failure: signs/symptoms and treatments.  Introduces Heart Failure Zone chart for action plan for heart failure.  Written material given at graduation.   Infection Prevention: - Provides verbal and written material to individual with discussion of infection control including proper hand washing and proper equipment cleaning during exercise session. Flowsheet Row Cardiac Rehab from 04/28/2022 in [facility] Cardiac and Pulmonary Rehab  Date 04/28/22  Educator [facility]  Instruction Review  Code 1- Verbalizes Understanding       Falls Prevention: - Provides verbal and written material to individual with discussion of falls prevention and safety. Flowsheet Row Cardiac Rehab from 04/28/2022 in [facility] Cardiac and Pulmonary Rehab  Education need identified 03/29/22  Date 03/29/22  Educator [place]  Instruction Review Code 1- Verbalizes Understanding       Other: -Provides group and verbal instruction on various topics (see comments)   Knowledge Questionnaire Score:  Knowledge Questionnaire Score - 04/28/22 1258       Knowledge Questionnaire Score   Pre Score 25/26              Core Components/Risk Factors/Patient Goals at Admission:  Personal Goals and Risk Factors at Admission - 04/28/22 1259       Core Components/Risk Factors/Patient Goals on Admission    Weight Management Yes;Weight Loss    Intervention Weight Management: Develop a combined nutrition and exercise program designed to reach desired caloric intake, while maintaining appropriate intake of nutrient and fiber, sodium and fats, and appropriate energy expenditure required for the weight goal.;Weight Management: Provide education and appropriate resources to help participant work on and attain dietary goals.    Admit Weight 163 lb 6.4 oz (74.1 kg)    Goal Weight: Short Term 155 lb (70.3 kg)    Goal Weight: Long Term 155 lb (70.3 kg)    Expected Outcomes Short Term: Continue to assess and modify interventions until short term weight is achieved;Long Term: Adherence to nutrition and physical activity/exercise program aimed toward attainment of established weight goal;Understanding recommendations for meals to include 15-35% energy as protein, 25-35% energy from fat, 35-60% energy from carbohydrates, less than '200mg'$  of dietary cholesterol, 20-35 gm of total fiber daily;Understanding of distribution of calorie intake throughout the day with the consumption of 4-5 meals/snacks;Weight Loss: Understanding of general recommendations for a balanced deficit meal plan, which promotes 1-2 lb weight loss per week and includes a negative energy balance of [phone] kcal/d    Diabetes Yes   diet controlled   Intervention Provide education about signs/symptoms and action to take for hypo/hyperglycemia.;Provide education about proper nutrition, including hydration, and aerobic/resistive exercise prescription along with prescribed medications to achieve blood glucose in normal ranges: Fasting glucose 65-99 mg/dL    Expected Outcomes Short Term: Participant verbalizes understanding of the signs/symptoms and immediate  care of hyper/hypoglycemia, proper foot care and importance of medication, aerobic/resistive exercise and nutrition plan for blood glucose control.;Long Term: Attainment of HbA1C < 7%.    Hypertension Yes    Intervention Provide education on lifestyle modifcations including regular physical activity/exercise, weight management, moderate sodium restriction and increased consumption of fresh fruit, vegetables, and low fat dairy, alcohol moderation, and smoking cessation.;Monitor prescription use compliance.    Expected Outcomes Short Term: Continued assessment and intervention until BP is < 140/52m HG in hypertensive participants. < 130/864mHG in hypertensive participants with diabetes, heart failure or chronic kidney disease.;Long Term: Maintenance of blood pressure at goal levels.    Lipids Yes    Intervention Provide education and support for participant on nutrition & aerobic/resistive exercise along with prescribed medications to achieve LDL '70mg'$ , HDL >'40mg'$ .    Expected Outcomes Short Term: Participant states understanding of desired cholesterol values and is compliant with medications prescribed. Participant is following exercise prescription and nutrition guidelines.;Long Term: Cholesterol controlled with medications as prescribed, with individualized exercise RX and with personalized nutrition plan. Value goals: LDL < '70mg'$ , HDL > 40 mg.  Education:Diabetes - Individual verbal and written instruction to review signs/symptoms of diabetes, desired ranges of glucose level fasting, after meals and with exercise. Acknowledge that pre and post exercise glucose checks will be done for 3 sessions at entry of program.   Core Components/Risk Factors/Patient Goals Review:    Core Components/Risk Factors/Patient Goals at Discharge (Final Review):    ITP Comments:  ITP Comments     Row Name 03/29/22 0906 04/28/22 1245 05/02/22 1035 05/11/22 0758     ITP Comments Virtual orientation  call completed today. He has an appointment on Date: 04/04/22  for EP eval and gym Orientation.  Documentation of diagnosis can be found in [facility] Date: 03/11/22 . Completed 6MWT and gym orientation. Initial ITP created and sent for review to Dr. Emily Filbert, Medical Director. First full day of exercise!  Patient was oriented to gym and equipment including functions, settings, policies, and procedures.  Patient's individual exercise prescription and treatment plan were reviewed.  All starting workloads were established based on the results of the 6 minute walk test done at initial orientation visit.  The plan for exercise progression was also introduced and progression will be customized based on patient's performance and goals. 30 Day review completed. Medical Director ITP review done, changes made as directed, and signed approval by Medical Director.    New to program             Comments:

## 2022-05-12 ENCOUNTER — Other Ambulatory Visit (HOSPITAL_COMMUNITY): Payer: Self-pay

## 2022-05-12 ENCOUNTER — Encounter: Payer: Medicare Other | Admitting: *Deleted

## 2022-05-12 DIAGNOSIS — Z955 Presence of coronary angioplasty implant and graft: Secondary | ICD-10-CM | POA: Diagnosis not present

## 2022-05-12 DIAGNOSIS — I214 Non-ST elevation (NSTEMI) myocardial infarction: Secondary | ICD-10-CM

## 2022-05-12 NOTE — Progress Notes (Signed)
Daily Session Note  Patient Details  Name: Jeremy Gilbert MRN: [id number] Date of Birth: [birth year] Referring Provider:   Flowsheet Row Cardiac Rehab from 04/28/2022 in [facility] Cardiac and Pulmonary Rehab  Referring Provider End, Harrell Gave MD       Encounter Date: 05/12/2022  Check In:  Session Check In - 05/12/22 5916       Check-In   Supervising physician immediately available to respond to emergencies See telemetry face sheet for immediately available ER MD    Location [facility]-Cardiac & Pulmonary Rehab    Staff Present Hope Budds, RDN, LDN;Yader Rosebud Poles, RN, [place]    Virtual Visit No    Medication changes reported     No    Fall or balance concerns reported    No    Warm-up and Cool-down Performed on first and last piece of equipment    Resistance Training Performed Yes    VAD Patient? No    PAD/SET Patient? No      Pain Assessment   Currently in Pain? No/denies                Social History   Tobacco Use  Smoking Status Former   Types: Cigarettes   Start date: 01/07/1968   Quit date: 01/07/2000   Years since quitting: 22.3  Smokeless Tobacco Never    Goals Met:  Independence with exercise equipment Exercise tolerated well No report of concerns or symptoms today Strength training completed today  Goals Unmet:  Not Applicable  Comments: Pt able to follow exercise prescription today without complaint.  Will continue to monitor for progression.    Dr. Emily Filbert is Medical Director for [place].  Dr. Ottie Glazier is Medical Director for [facility] Pulmonary Rehabilitation.

## 2022-05-13 ENCOUNTER — Other Ambulatory Visit (HOSPITAL_COMMUNITY): Payer: Self-pay

## 2022-05-13 ENCOUNTER — Encounter: Payer: Medicare Other | Admitting: *Deleted

## 2022-05-13 DIAGNOSIS — Z955 Presence of coronary angioplasty implant and graft: Secondary | ICD-10-CM | POA: Diagnosis not present

## 2022-05-13 DIAGNOSIS — I214 Non-ST elevation (NSTEMI) myocardial infarction: Secondary | ICD-10-CM

## 2022-05-13 NOTE — Progress Notes (Signed)
Daily Session Note  Patient Details  Name: Jeremy Gilbert MRN: [id number] Date of Birth: [birth year] Referring Provider:   Flowsheet Row Cardiac Rehab from 04/28/2022 in [facility] Cardiac and Pulmonary Rehab  Referring Provider End, Harrell Gave MD       Encounter Date: 05/13/2022  Check In:  Session Check In - 05/13/22 0842       Check-In   Supervising physician immediately available to respond to emergencies See telemetry face sheet for immediately available ER MD    Location [facility]-Cardiac & Pulmonary Rehab    Staff Present Heath Lark, RN, BSN, CCRP;Jessica [place], MA, RCEP, CCRP, CCET;Esteban [place], [place]    Virtual Visit No    Medication changes reported     No    Fall or balance concerns reported    No    Warm-up and Cool-down Performed on first and last piece of equipment    Resistance Training Performed Yes    VAD Patient? No    PAD/SET Patient? No      Pain Assessment   Currently in Pain? No/denies                Social History   Tobacco Use  Smoking Status Former   Types: Cigarettes   Start date: 01/07/1968   Quit date: 01/07/2000   Years since quitting: 22.3  Smokeless Tobacco Never    Goals Met:  Independence with exercise equipment Exercise tolerated well No report of concerns or symptoms today  Goals Unmet:  Not Applicable  Comments: Pt able to follow exercise prescription today without complaint.  Will continue to monitor for progression.    Dr. Emily Filbert is Medical Director for [place].  Dr. Ottie Glazier is Medical Director for [facility] Pulmonary Rehabilitation.

## 2022-05-16 ENCOUNTER — Encounter: Payer: Medicare Other | Admitting: *Deleted

## 2022-05-16 DIAGNOSIS — Z955 Presence of coronary angioplasty implant and graft: Secondary | ICD-10-CM | POA: Diagnosis not present

## 2022-05-16 DIAGNOSIS — I214 Non-ST elevation (NSTEMI) myocardial infarction: Secondary | ICD-10-CM

## 2022-05-16 NOTE — Progress Notes (Signed)
Daily Session Note  Patient Details  Name: Jeremy Gilbert MRN: [id number] Date of Birth: [birth year] Referring Provider:   Flowsheet Row Cardiac Rehab from 04/28/2022 in [facility] Cardiac and Pulmonary Rehab  Referring Provider End, Harrell Gave MD       Encounter Date: 05/16/2022  Check In:  Session Check In - 05/16/22 0806       Check-In   Supervising physician immediately available to respond to emergencies See telemetry face sheet for immediately available ER MD    Location [facility]-Cardiac & Pulmonary Rehab    Staff Present Earlean Shawl, BS, ACSM CEP, Exercise Physiologist;Kiyon Rosebud Poles, RN, [place]    Virtual Visit No    Medication changes reported     No    Fall or balance concerns reported    No    Warm-up and Cool-down Performed on first and last piece of equipment    Resistance Training Performed Yes    VAD Patient? No    PAD/SET Patient? No      Pain Assessment   Currently in Pain? No/denies                Social History   Tobacco Use  Smoking Status Former   Types: Cigarettes   Start date: 01/07/1968   Quit date: 01/07/2000   Years since quitting: 22.3  Smokeless Tobacco Never    Goals Met:  Independence with exercise equipment Exercise tolerated well No report of concerns or symptoms today Strength training completed today  Goals Unmet:  Not Applicable  Comments: Pt able to follow exercise prescription today without complaint.  Will continue to monitor for progression.    Dr. Emily Filbert is Medical Director for [place].  Dr. Ottie Glazier is Medical Director for [facility] Pulmonary Rehabilitation.

## 2022-05-18 ENCOUNTER — Encounter: Payer: Medicare Other | Admitting: *Deleted

## 2022-05-18 DIAGNOSIS — Z955 Presence of coronary angioplasty implant and graft: Secondary | ICD-10-CM | POA: Diagnosis not present

## 2022-05-18 DIAGNOSIS — I214 Non-ST elevation (NSTEMI) myocardial infarction: Secondary | ICD-10-CM

## 2022-05-18 NOTE — Progress Notes (Signed)
Daily Session Note  Patient Details  Name: Jeremy Gilbert MRN: [id number] Date of Birth: 06/07/1951 Referring Provider:   Flowsheet Row Cardiac Rehab from 04/28/2022 in [facility] Cardiac and Pulmonary Rehab  Referring Provider End, Christopher MD       Encounter Date: 05/18/2022  Check In:  Session Check In - 05/18/22 0809       Check-In   Supervising physician immediately available to respond to emergencies See telemetry face sheet for immediately available ER MD    Location [facility]-Cardiac & Pulmonary Rehab    Staff Present Noah Tickle, BS, Exercise Physiologist;Deforest Hood, RCP,RRT,BSRT;Megan Smith, RN, ADN    Virtual Visit No    Medication changes reported     No    Fall or balance concerns reported    No    Warm-up and Cool-down Performed on first and last piece of equipment    Resistance Training Performed Yes    VAD Patient? No    PAD/SET Patient? No      Pain Assessment   Currently in Pain? No/denies                Social History   Tobacco Use  Smoking Status Former   Types: Cigarettes   Start date: 01/07/1968   Quit date: 01/07/2000   Years since quitting: 22.3  Smokeless Tobacco Never    Goals Met:  Independence with exercise equipment Exercise tolerated well No report of concerns or symptoms today Strength training completed today  Goals Unmet:  Not Applicable  Comments: Pt able to follow exercise prescription today without complaint.  Will continue to monitor for progression.    Dr. Mark Miller is Medical Director for [facility] Cardiac [facility].  Dr. Fuad Aleskerov is Medical Director for [facility] Pulmonary Rehabilitation. 

## 2022-05-20 ENCOUNTER — Ambulatory Visit: Payer: Medicare Other | Attending: Internal Medicine | Admitting: Internal Medicine

## 2022-05-20 ENCOUNTER — Encounter: Payer: Medicare Other | Admitting: *Deleted

## 2022-05-20 ENCOUNTER — Encounter: Payer: Self-pay | Admitting: Internal Medicine

## 2022-05-20 VITALS — BP 128/74 | HR 62 | Ht 67.0 in | Wt 164.0 lb

## 2022-05-20 DIAGNOSIS — I2510 Atherosclerotic heart disease of native coronary artery without angina pectoris: Secondary | ICD-10-CM | POA: Insufficient documentation

## 2022-05-20 DIAGNOSIS — I214 Non-ST elevation (NSTEMI) myocardial infarction: Secondary | ICD-10-CM

## 2022-05-20 DIAGNOSIS — I10 Essential (primary) hypertension: Secondary | ICD-10-CM | POA: Insufficient documentation

## 2022-05-20 DIAGNOSIS — Z955 Presence of coronary angioplasty implant and graft: Secondary | ICD-10-CM | POA: Diagnosis not present

## 2022-05-20 DIAGNOSIS — E7849 Other hyperlipidemia: Secondary | ICD-10-CM | POA: Insufficient documentation

## 2022-05-20 MED ORDER — TICAGRELOR 90 MG PO TABS: 90.0000 mg | ORAL_TABLET | Freq: Two times a day (BID) | ORAL | 0 refills | Status: DC

## 2022-05-20 NOTE — Patient Instructions (Signed)
Medication Instructions:  Your physician recommends that you continue on your current medications as directed. Please refer to the Current Medication list given to you today.  *If you need a refill on your cardiac medications before your next appointment, please call your pharmacy*  Lab Work: NONE ordered at this time of appointment   If you have labs (blood work) drawn today and your tests are completely normal, you will receive your results only by: [place] (if you have MyChart) OR A paper copy in the mail If you have any lab test that is abnormal or we need to change your treatment, we will call you to review the results.  Testing/Procedures: NONE ordered at this time of appointment   Follow-Up: At [facility], you and your health needs are our priority.  As part of our continuing mission to provide you with exceptional heart care, we have created designated Provider Care Teams.  These Care Teams include your primary Cardiologist (physician) and Advanced Practice Providers (APPs -  Physician Assistants and Nurse Practitioners) who all work together to provide you with the care you need, when you need it.   Your next appointment:   2-3 month(s)  The format for your next appointment:   In Person  Provider:   Nelva Bush, MD    Other Instructions   Important Information About Sugar

## 2022-05-20 NOTE — Progress Notes (Signed)
Daily Session Note  Patient Details  Name: Jeremy Gilbert MRN: [id number] Date of Birth: [birth year] Referring Provider:   Flowsheet Row Cardiac Rehab from 04/28/2022 in [facility] Cardiac and Pulmonary Rehab  Referring Provider End, Harrell Gave MD       Encounter Date: 05/20/2022  Check In:  Session Check In - 05/20/22 0825       Check-In   Supervising physician immediately available to respond to emergencies See telemetry face sheet for immediately available ER MD    Location [facility]-Cardiac & Pulmonary Rehab    Staff Present Nyoka Cowden, RN, BSN, Fenton Foy, BS, Exercise Physiologist;Susanne Bice, RN, BSN, CCRP    Virtual Visit No    Medication changes reported     No    Fall or balance concerns reported    No    Tobacco Cessation No Change    Warm-up and Cool-down Performed on first and last piece of equipment    Resistance Training Performed Yes    VAD Patient? No    PAD/SET Patient? No      Pain Assessment   Currently in Pain? No/denies                Social History   Tobacco Use  Smoking Status Former   Types: Cigarettes   Start date: 01/07/1968   Quit date: 01/07/2000   Years since quitting: 22.3  Smokeless Tobacco Never    Goals Met:  Independence with exercise equipment Exercise tolerated well No report of concerns or symptoms today  Goals Unmet:  Not Applicable  Comments: Pt able to follow exercise prescription today without complaint.  Will continue to monitor for progression.    Dr. Emily Filbert is Medical Director for [place].  Dr. Ottie Glazier is Medical Director for [facility] Pulmonary Rehabilitation.

## 2022-05-20 NOTE — Progress Notes (Unsigned)
Follow-up Outpatient Visit Date: 05/20/2022  Primary Care Provider: Juluis Pitch, MD [address] [id number]  Chief Complaint: Follow-up coronary artery disease  HPI:  Jeremy Gilbert is a 71 y.o. male with history of coronary artery disease with NSTEMI and complex PCI to the RCA in 03/2022, hypertension, hyperlipidemia, and borderline diabetes mellitus, who presents for follow-up of coronary artery disease.  He presented to [facility] in early August with several days of intermittent chest discomfort and diaphoresis.  He had diffuse ST segment changes and mild troponin elevation, prompting catheterization by Dr. Clayborn Bigness.  Catheterization showed a focal high-grade stenosis of the mid RCA, which Dr. Call would was unable to intervene upon.  Due to abrupt vessel closure, he asked me to assist during the PCI.  I was able to cross the lesion with a wire, which reestablished flow, but could not perform PCI due to severity and calcification of the stenosis.  He was transferred to [facility] and subsequently underwent atherectomy and stent placement by Dr. Ali Lowe.  He was seen in follow-up 3 days after discharge by Dr. Harriet Masson due to shortness of breath that he attributed to atorvastatin.  He was switched to rosuvastatin.  He also complained of significant right groin pain where ultrasound showed a hematoma but no evidence of ongoing bleeding or pseudoaneurysm.  Today, Jeremy Gilbert reports that he is feeling well without any chest pain, shortness of breath, palpitations, lightheadedness, or edema.  His groin hematoma has resolved.  He only took rosuvastatin for 3 days as he also had breathing problems while on this.  He reports waking up in the middle the night gasping for breath.  As soon as he stopped the medication, this resolved.  He is taking his other medications as prescribed including dual antiplatelet therapy with aspirin and  ticagrelor.  --------------------------------------------------------------------------------------------------  Past Medical History:  Diagnosis Date   Actinic keratosis    Basal cell carcinoma 2005   L alar crease   Coronary artery disease 03/2022   NSTEMI s/p PCI to RCA   Tobacco abuse 01/27/2015   Past Surgical History:  Procedure Laterality Date   CENTRAL LINE INSERTION  03/14/2022   Procedure: CENTRAL LINE INSERTION;  Surgeon: Early Osmond, MD;  Location: [place] CV LAB;  Service: Cardiovascular;;   CORONARY ATHERECTOMY N/A 03/14/2022   Procedure: CORONARY ATHERECTOMY;  Surgeon: Early Osmond, MD;  Location: [place] CV LAB;  Service: Cardiovascular;  Laterality: N/A;   CORONARY STENT INTERVENTION N/A 03/11/2022   Procedure: CORONARY STENT INTERVENTION;  Surgeon: Yolonda Kida, MD;  Location: [place] CV LAB;  Service: Cardiovascular;  Laterality: N/A;   CORONARY STENT INTERVENTION N/A 03/14/2022   Procedure: CORONARY STENT INTERVENTION;  Surgeon: Early Osmond, MD;  Location: [place] CV LAB;  Service: Cardiovascular;  Laterality: N/A;   INTRAVASCULAR IMAGING/OCT N/A 03/14/2022   Procedure: INTRAVASCULAR IMAGING/OCT;  Surgeon: Early Osmond, MD;  Location: [place] CV LAB;  Service: Cardiovascular;  Laterality: N/A;   LEFT HEART CATH N/A 03/14/2022   Procedure: Left Heart Cath;  Surgeon: Early Osmond, MD;  Location: [place] CV LAB;  Service: Cardiovascular;  Laterality: N/A;   LEFT HEART CATH AND CORONARY ANGIOGRAPHY N/A 03/11/2022   Procedure: LEFT HEART CATH AND CORONARY ANGIOGRAPHY;  Surgeon: Yolonda Kida, MD;  Location: [place] CV LAB;  Service: Cardiovascular;  Laterality: N/A;  following DR. Callwood's first case   OTHER SURGICAL HISTORY Right 04/08/1994   Right external fixation  of tibia and fibia    Recent CV Pertinent Labs: Lab Results  Component Value Date   CHOL 109 03/11/2022   HDL 28 (L) 03/11/2022   LDLCALC 46  03/11/2022   TRIG 173 (H) 03/11/2022   CHOLHDL 3.9 03/11/2022   INR 1.0 03/10/2022   K 3.4 (L) 03/15/2022   MG 2.2 03/10/2022   BUN 9 03/15/2022   CREATININE 1.04 03/15/2022    Past medical and surgical history were reviewed and updated in EPIC.  Current Meds  Medication Sig   acetaminophen (TYLENOL) 500 MG tablet Take 500 mg by mouth every 6 (six) hours as needed for mild pain, moderate pain, fever or headache.   KRILL OIL PO Take 350 mg by mouth 2 (two) times daily.   losartan-hydrochlorothiazide (HYZAAR) 50-12.5 MG tablet Take 1 tablet by mouth daily.   metoprolol succinate (TOPROL-XL) 100 MG 24 hr tablet Take 1 tablet by mouth 2 (two) times daily.   nitroGLYCERIN (NITROSTAT) 0.4 MG SL tablet Place 1 tablet (0.4 mg total) under the tongue every 5 (five) minutes as needed for chest pain.   ticagrelor (BRILINTA) 90 MG TABS tablet Take 1 tablet (90 mg total) by mouth 2 (two) times daily.    Allergies: Ace inhibitors, Hydrocodone-acetaminophen, Oxycodone-acetaminophen, and Statins  Social History   Tobacco Use   Smoking status: Former    Types: Cigarettes    Start date: 01/07/1968    Quit date: 01/07/2000    Years since quitting: 22.3   Smokeless tobacco: Never  Vaping Use   Vaping Use: Never used  Substance Use Topics   Alcohol use: Not Currently    Alcohol/week: 18.0 standard drinks of alcohol    Types: 18 Cans of beer per week   Drug use: Never    Family History  Problem Relation Age of Onset   Heart attack Mother     Review of Systems: A 12-system review of systems was performed and was negative except as noted in the HPI.  --------------------------------------------------------------------------------------------------  Physical Exam: BP 128/74 (BP Location: Left Arm, Patient Position: Sitting, Cuff Size: Normal)   Pulse 62   Ht '5\' 7"'$  (1.702 m)   Wt 164 lb (74.4 kg)   SpO2 98%   BMI 25.69 kg/m   General:  NAD. Neck: No JVD or HJR. Lungs: Clear to  auscultation bilaterally without wheezes or crackles. Heart: Regular rate and rhythm without murmurs, rubs, or gallops. Abdomen: Soft, nontender, nondistended. Extremities: No lower extremity edema.  EKG: Normal sinus rhythm with inferior infarct and nonspecific ST changes in the septal leads.  Septal ST changes are slightly more pronounced than on prior tracing from 03/18/2022.  Lab Results  Component Value Date   WBC 9.3 03/15/2022   HGB 12.1 (L) 03/15/2022   HCT 34.9 (L) 03/15/2022   MCV 92.8 03/15/2022   PLT 179 03/15/2022    Lab Results  Component Value Date   NA 137 03/15/2022   K 3.4 (L) 03/15/2022   CL 109 03/15/2022   CO2 17 (L) 03/15/2022   BUN 9 03/15/2022   CREATININE 1.04 03/15/2022   GLUCOSE 114 (H) 03/15/2022   ALT 23 03/10/2022    Lab Results  Component Value Date   CHOL 109 03/11/2022   HDL 28 (L) 03/11/2022   LDLCALC 46 03/11/2022   TRIG 173 (H) 03/11/2022   CHOLHDL 3.9 03/11/2022    --------------------------------------------------------------------------------------------------  ASSESSMENT AND PLAN: Coronary artery disease: Jeremy Gilbert has recovered well from his NSTEMI and complicated PCI to  the RCA complicated by right groin hematoma.  He will need to complete 12 months of dual antiplatelet therapy with aspirin and ticagrelor from the time of his NSTEMI in August.  We discussed the importance of aggressive lipid control that has been challenging in light of his unusual intolerance of atorvastatin and rosuvastatin.  He wishes to defer any lipid therapy at this point pending repeat lipid panel with Dr. Lovie Macadamia next month.  Hyperlipidemia: Lipid panel in August was notable for LDL of 46 and triglycerides of 173 off therapy.  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  He has not tolerated atorvastatin or rosuvastatin due to significant shortness of breath manifesting primarily as PND.  We discussed the unusual nature of  this reaction, though Jeremy Gilbert does not wish to be rechallenged with a statin.  PCSK9 inhibitor would also be appropriate, particularly given its benefits on LP(a).  Jeremy Gilbert, we will defer adding any lipid therapy at this time, as he is due for a lipid panel next month with Dr. Lovie Macadamia.  I would favor having Jeremy Gilbert consult with our lipid clinic in the future to see if he would qualify for any investigational therapies aimed at LP(a) and to help [place] ensure appropriate secondary prevention of his CAD.  He can continue [facility] for now, though transition to [place] in the future may be beneficial given its established cardiovascular benefits in the setting of secondary prevention with triglycerides greater than 150.  Hypertension: BP well controlled on current regimen of metoprolol and losartan-HCTZ.  No medication changes today.  Follow-up: Return to clinic in 3 months.  Nelva Bush, MD 05/20/2022 10:28 AM

## 2022-05-21 ENCOUNTER — Encounter: Payer: Self-pay | Admitting: Internal Medicine

## 2022-05-21 DIAGNOSIS — E1169 Type 2 diabetes mellitus with other specified complication: Secondary | ICD-10-CM | POA: Insufficient documentation

## 2022-05-21 DIAGNOSIS — E785 Hyperlipidemia, unspecified: Secondary | ICD-10-CM | POA: Insufficient documentation

## 2022-05-21 DIAGNOSIS — E7849 Other hyperlipidemia: Secondary | ICD-10-CM | POA: Insufficient documentation

## 2022-05-21 DIAGNOSIS — I2510 Atherosclerotic heart disease of native coronary artery without angina pectoris: Secondary | ICD-10-CM | POA: Insufficient documentation

## 2022-05-23 ENCOUNTER — Encounter: Payer: Medicare Other | Admitting: *Deleted

## 2022-05-23 DIAGNOSIS — Z955 Presence of coronary angioplasty implant and graft: Secondary | ICD-10-CM

## 2022-05-23 DIAGNOSIS — I214 Non-ST elevation (NSTEMI) myocardial infarction: Secondary | ICD-10-CM

## 2022-05-23 NOTE — Progress Notes (Signed)
Daily Session Note  Patient Details  Name: Jeremy Gilbert MRN: [id number] Date of Birth: June 04, 1951 Referring Provider:   Flowsheet Row Cardiac Rehab from 04/28/2022 in [facility] Cardiac and Pulmonary Rehab  Referring Provider End, Harrell Gave MD       Encounter Date: 05/23/2022  Check In:  Session Check In - 05/23/22 0814       Check-In   Supervising physician immediately available to respond to emergencies See telemetry face sheet for immediately available ER MD    Location [facility]-Cardiac & Pulmonary Rehab    Staff Present Heath Lark, RN, BSN, Laveda Norman, BS, ACSM CEP, Exercise Physiologist;Jamien [place], [place]    Virtual Visit No    Medication changes reported     No    Fall or balance concerns reported    No    Warm-up and Cool-down Performed on first and last piece of equipment    Resistance Training Performed Yes    VAD Patient? No    PAD/SET Patient? No      Pain Assessment   Currently in Pain? No/denies                Social History   Tobacco Use  Smoking Status Former   Types: Cigarettes   Start date: 01/07/1968   Quit date: 01/07/2000   Years since quitting: 22.3  Smokeless Tobacco Never    Goals Met:  Independence with exercise equipment Exercise tolerated well No report of concerns or symptoms today  Goals Unmet:  Not Applicable  Comments: Pt able to follow exercise prescription today without complaint.  Will continue to monitor for progression.  Reviewed home exercise with pt today.  Pt plans to walk and do yard work for exercise.  Reviewed THR, pulse, RPE, sign and symptoms, pulse oximetery and when to call 911 or MD.  Also discussed weather considerations and indoor options.  Pt voiced understanding.   Dr. Emily Filbert is Medical Director for [place].  Dr. Ottie Glazier is Medical Director for [facility] Pulmonary Rehabilitation.

## 2022-05-25 ENCOUNTER — Encounter: Payer: Medicare Other | Admitting: *Deleted

## 2022-05-25 DIAGNOSIS — Z955 Presence of coronary angioplasty implant and graft: Secondary | ICD-10-CM

## 2022-05-25 DIAGNOSIS — I214 Non-ST elevation (NSTEMI) myocardial infarction: Secondary | ICD-10-CM

## 2022-05-25 NOTE — Progress Notes (Signed)
Daily Session Note  Patient Details  Name: Jeremy Gilbert MRN: [id number] Date of Birth: 1950/09/05 Referring Provider:   Flowsheet Row Cardiac Rehab from 04/28/2022 in [facility] Cardiac and Pulmonary Rehab  Referring Provider End, Harrell Gave MD       Encounter Date: 05/25/2022  Check In:  Session Check In - 05/25/22 0814       Check-In   Supervising physician immediately available to respond to emergencies See telemetry face sheet for immediately available ER MD    Location [facility]-Cardiac & Pulmonary Rehab    Staff Present Heath Lark, RN, BSN, CCRP;[place], RCP,RRT,BSRT;Noah Weir, [place], Exercise Physiologist    Virtual Visit No    Medication changes reported     No    Fall or balance concerns reported    No    Warm-up and Cool-down Performed on first and last piece of equipment    Resistance Training Performed Yes    VAD Patient? No    PAD/SET Patient? No      Pain Assessment   Currently in Pain? No/denies                Social History   Tobacco Use  Smoking Status Former   Types: Cigarettes   Start date: 01/07/1968   Quit date: 01/07/2000   Years since quitting: 22.3  Smokeless Tobacco Never    Goals Met:  Independence with exercise equipment Exercise tolerated well No report of concerns or symptoms today  Goals Unmet:  Not Applicable  Comments: Pt able to follow exercise prescription today without complaint.  Will continue to monitor for progression.    Dr. Emily Filbert is Medical Director for [place].  Dr. Ottie Glazier is Medical Director for [facility] Pulmonary Rehabilitation.

## 2022-05-27 ENCOUNTER — Encounter: Payer: Medicare Other | Admitting: *Deleted

## 2022-05-27 DIAGNOSIS — Z955 Presence of coronary angioplasty implant and graft: Secondary | ICD-10-CM

## 2022-05-27 DIAGNOSIS — I214 Non-ST elevation (NSTEMI) myocardial infarction: Secondary | ICD-10-CM

## 2022-05-27 NOTE — Progress Notes (Signed)
Daily Session Note  Patient Details  Name: JESSE NOSBISCH MRN: [id number] Date of Birth: January 15, 1951 Referring Provider:   Flowsheet Row Cardiac Rehab from 04/28/2022 in [facility] Cardiac and Pulmonary Rehab  Referring Provider End, Harrell Gave MD       Encounter Date: 05/27/2022  Check In:  Session Check In - 05/27/22 0818       Check-In   Supervising physician immediately available to respond to emergencies See telemetry face sheet for immediately available ER MD    Location [facility]-Cardiac & Pulmonary Rehab    Staff Present Heath Lark, RN, BSN, CCRP;Jessica Rosa, MA, RCEP, CCRP, CCET;Arville Gary, [place]    Virtual Visit No    Medication changes reported     No    Fall or balance concerns reported    No    Warm-up and Cool-down Performed on first and last piece of equipment    Resistance Training Performed Yes    VAD Patient? No    PAD/SET Patient? No      Pain Assessment   Currently in Pain? No/denies                Social History   Tobacco Use  Smoking Status Former   Types: Cigarettes   Start date: 01/07/1968   Quit date: 01/07/2000   Years since quitting: 22.4  Smokeless Tobacco Never    Goals Met:  Independence with exercise equipment Exercise tolerated well No report of concerns or symptoms today  Goals Unmet:  Not Applicable  Comments: Pt able to follow exercise prescription today without complaint.  Will continue to monitor for progression.    Dr. Emily Filbert is Medical Director for [place].  Dr. Ottie Glazier is Medical Director for [facility] Pulmonary Rehabilitation.

## 2022-05-30 ENCOUNTER — Encounter: Payer: Medicare Other | Admitting: *Deleted

## 2022-05-30 DIAGNOSIS — Z955 Presence of coronary angioplasty implant and graft: Secondary | ICD-10-CM | POA: Diagnosis not present

## 2022-05-30 DIAGNOSIS — I214 Non-ST elevation (NSTEMI) myocardial infarction: Secondary | ICD-10-CM

## 2022-05-30 NOTE — Progress Notes (Signed)
Daily Session Note  Patient Details  Name: Jeremy Gilbert MRN: [id number] Date of Birth: 1950-10-26 Referring Provider:   Flowsheet Row Cardiac Rehab from 04/28/2022 in [facility] Cardiac and Pulmonary Rehab  Referring Provider End, Harrell Gave MD       Encounter Date: 05/30/2022  Check In:  Session Check In - 05/30/22 0830       Check-In   Supervising physician immediately available to respond to emergencies See telemetry face sheet for immediately available ER MD    Location [facility]-Cardiac & Pulmonary Rehab    Staff Present Earlean Shawl, BS, ACSM CEP, Exercise Physiologist;Kayden Rosebud Poles, RN, [place]    Virtual Visit No    Medication changes reported     No    Fall or balance concerns reported    No    Warm-up and Cool-down Performed on first and last piece of equipment    Resistance Training Performed Yes    VAD Patient? No    PAD/SET Patient? No      Pain Assessment   Currently in Pain? No/denies                Social History   Tobacco Use  Smoking Status Former   Types: Cigarettes   Start date: 01/07/1968   Quit date: 01/07/2000   Years since quitting: 22.4  Smokeless Tobacco Never    Goals Met:  Independence with exercise equipment Exercise tolerated well No report of concerns or symptoms today Strength training completed today  Goals Unmet:  Not Applicable  Comments: Pt able to follow exercise prescription today without complaint.  Will continue to monitor for progression.    Dr. Emily Filbert is Medical Director for [place].  Dr. Ottie Glazier is Medical Director for [facility] Pulmonary Rehabilitation.

## 2022-06-01 ENCOUNTER — Encounter: Payer: Medicare Other | Admitting: *Deleted

## 2022-06-01 DIAGNOSIS — Z955 Presence of coronary angioplasty implant and graft: Secondary | ICD-10-CM | POA: Diagnosis not present

## 2022-06-01 DIAGNOSIS — I214 Non-ST elevation (NSTEMI) myocardial infarction: Secondary | ICD-10-CM

## 2022-06-01 NOTE — Progress Notes (Signed)
Daily Session Note  Patient Details  Name: Jeremy Gilbert MRN: [id number] Date of Birth: [birth year] Referring Provider:   Flowsheet Row Cardiac Rehab from 04/28/2022 in [facility] Cardiac and Pulmonary Rehab  Referring Provider End, Harrell Gave MD       Encounter Date: 06/01/2022  Check In:  Session Check In - 06/01/22 0802       Check-In   Supervising physician immediately available to respond to emergencies See telemetry face sheet for immediately available ER MD    Location [facility]Cardiac & Pulmonary Rehab    Staff Present Antionette Fairy, BS, Exercise Physiologist;Dewie Rosebud Poles, RN, [place]    Virtual Visit No    Medication changes reported     No    Fall or balance concerns reported    No    Warm-up and Cool-down Performed on first and last piece of equipment    Resistance Training Performed Yes    VAD Patient? No    PAD/SET Patient? No      Pain Assessment   Currently in Pain? No/denies                Social History   Tobacco Use  Smoking Status Former   Types: Cigarettes   Start date: 01/07/1968   Quit date: 01/07/2000   Years since quitting: 22.4  Smokeless Tobacco Never    Goals Met:  Independence with exercise equipment Exercise tolerated well No report of concerns or symptoms today Strength training completed today  Goals Unmet:  Not Applicable  Comments: Pt able to follow exercise prescription today without complaint.  Will continue to monitor for progression.    Dr. Emily Filbert is Medical Director for [place].  Dr. Ottie Glazier is Medical Director for [facility] Pulmonary Rehabilitation.

## 2022-06-03 ENCOUNTER — Encounter: Payer: Medicare Other | Admitting: *Deleted

## 2022-06-03 DIAGNOSIS — I214 Non-ST elevation (NSTEMI) myocardial infarction: Secondary | ICD-10-CM

## 2022-06-03 DIAGNOSIS — Z955 Presence of coronary angioplasty implant and graft: Secondary | ICD-10-CM | POA: Diagnosis not present

## 2022-06-03 NOTE — Progress Notes (Signed)
Daily Session Note  Patient Details  Name: Jeremy Gilbert MRN: [id number] Date of Birth: 24-Aug-1950 Referring Provider:   Flowsheet Row Cardiac Rehab from 04/28/2022 in [facility] Cardiac and Pulmonary Rehab  Referring Provider End, Harrell Gave MD       Encounter Date: 06/03/2022  Check In:  Session Check In - 06/03/22 0821       Check-In   Supervising physician immediately available to respond to emergencies See telemetry face sheet for immediately available ER MD    Location [facility]-Cardiac & Pulmonary Rehab    Staff Present Heath Lark, RN, BSN, CCRP;Jessica [place], MA, RCEP, CCRP, CCET;Jeanpierre Ali [place]ina, [place]    Virtual Visit No    Medication changes reported     No    Fall or balance concerns reported    No    Warm-up and Cool-down Performed on first and last piece of equipment    Resistance Training Performed Yes    VAD Patient? No    PAD/SET Patient? No      Pain Assessment   Currently in Pain? No/denies                Social History   Tobacco Use  Smoking Status Former   Types: Cigarettes   Start date: 01/07/1968   Quit date: 01/07/2000   Years since quitting: 22.4  Smokeless Tobacco Never    Goals Met:  Independence with exercise equipment Exercise tolerated well No report of concerns or symptoms today  Goals Unmet:  Not Applicable  Comments: Pt able to follow exercise prescription today without complaint.  Will continue to monitor for progression.    Dr. Emily Filbert is Medical Director for [place].  Dr. Ottie Glazier is Medical Director for [facility] Pulmonary Rehabilitation.

## 2022-06-06 ENCOUNTER — Encounter: Payer: Medicare Other | Admitting: *Deleted

## 2022-06-06 DIAGNOSIS — Z955 Presence of coronary angioplasty implant and graft: Secondary | ICD-10-CM

## 2022-06-06 DIAGNOSIS — I214 Non-ST elevation (NSTEMI) myocardial infarction: Secondary | ICD-10-CM

## 2022-06-06 NOTE — Progress Notes (Signed)
Daily Session Note  Patient Details  Name: SAATVIK THIELMAN MRN: [id number] Date of Birth: [birth year] Referring Provider:   Flowsheet Row Cardiac Rehab from 04/28/2022 in [facility] Cardiac and Pulmonary Rehab  Referring Provider End, Harrell Gave MD       Encounter Date: 06/06/2022  Check In:  Session Check In - 06/06/22 0915       Check-In   Supervising physician immediately available to respond to emergencies See telemetry face sheet for immediately available ER MD    Location [facility]-Cardiac & Pulmonary Rehab    Staff Present Earlean Shawl, BS, ACSM CEP, Exercise Physiologist;Noah Tickle, BS, Exercise Physiologist;Kenedie Dirocco Tamala Julian, RN, Terie Purser, RCP,RRT,BSRT    Virtual Visit No    Medication changes reported     No    Fall or balance concerns reported    No    Warm-up and Cool-down Performed on first and last piece of equipment    Resistance Training Performed Yes    VAD Patient? No    PAD/SET Patient? No      Pain Assessment   Currently in Pain? No/denies                Social History   Tobacco Use  Smoking Status Former   Types: Cigarettes   Start date: 01/07/1968   Quit date: 01/07/2000   Years since quitting: 22.4  Smokeless Tobacco Never    Goals Met:  Independence with exercise equipment Exercise tolerated well No report of concerns or symptoms today Strength training completed today  Goals Unmet:  Not Applicable  Comments: Pt able to follow exercise prescription today without complaint.  Will continue to monitor for progression.    Dr. Emily Filbert is Medical Director for [place].  Dr. Ottie Glazier is Medical Director for [facility] Pulmonary Rehabilitation.

## 2022-06-07 ENCOUNTER — Encounter: Payer: Medicare Other | Admitting: *Deleted

## 2022-06-07 DIAGNOSIS — Z955 Presence of coronary angioplasty implant and graft: Secondary | ICD-10-CM

## 2022-06-07 DIAGNOSIS — I214 Non-ST elevation (NSTEMI) myocardial infarction: Secondary | ICD-10-CM

## 2022-06-07 NOTE — Progress Notes (Signed)
Daily Session Note  Patient Details  Name: Jeremy Gilbert MRN: [id number] Date of Birth: 12/10/1950 Referring Provider:   Flowsheet Row Cardiac Rehab from 04/28/2022 in [facility] Cardiac and Pulmonary Rehab  Referring Provider End, Harrell Gave MD       Encounter Date: 06/07/2022  Check In:  Session Check In - 06/07/22 0837       Check-In   Supervising physician immediately available to respond to emergencies See telemetry face sheet for immediately available ER MD    Location [facility]-Cardiac & Pulmonary Rehab    Staff Present Heath Lark, RN, BSN, CCRP;Jessica Warren, MA, RCEP, CCRP, Marylynn Pearson, MS, ASCM CEP, Exercise Physiologist    Virtual Visit No    Medication changes reported     No    Fall or balance concerns reported    No    Warm-up and Cool-down Performed on first and last piece of equipment    Resistance Training Performed Yes    VAD Patient? No    PAD/SET Patient? No      Pain Assessment   Currently in Pain? No/denies                Social History   Tobacco Use  Smoking Status Former   Types: Cigarettes   Start date: 01/07/1968   Quit date: 01/07/2000   Years since quitting: 22.4  Smokeless Tobacco Never    Goals Met:  Independence with exercise equipment Exercise tolerated well No report of concerns or symptoms today  Goals Unmet:  Not Applicable  Comments: Pt able to follow exercise prescription today without complaint.  Will continue to monitor for progression.    Dr. Emily Filbert is Medical Director for [place].  Dr. Ottie Glazier is Medical Director for [facility] Pulmonary Rehabilitation.

## 2022-06-08 ENCOUNTER — Encounter: Payer: Medicare Other | Attending: Internal Medicine | Admitting: *Deleted

## 2022-06-08 ENCOUNTER — Encounter: Payer: Self-pay | Admitting: *Deleted

## 2022-06-08 DIAGNOSIS — I214 Non-ST elevation (NSTEMI) myocardial infarction: Secondary | ICD-10-CM

## 2022-06-08 DIAGNOSIS — Z955 Presence of coronary angioplasty implant and graft: Secondary | ICD-10-CM | POA: Diagnosis not present

## 2022-06-08 NOTE — Progress Notes (Signed)
Daily Session Note  Patient Details  Name: WOODS GANGEMI MRN: [id number] Date of Birth: 03/26/1951 Referring Provider:   Flowsheet Row Cardiac Rehab from 04/28/2022 in [facility] Cardiac and Pulmonary Rehab  Referring Provider End, Harrell Gave MD       Encounter Date: 06/08/2022  Check In:  Session Check In - 06/08/22 0842       Check-In   Staff Present Antionette Fairy, BS, Exercise Physiologist;Kallen Rosebud Poles, RN, [place]    Virtual Visit No    Medication changes reported     No    Fall or balance concerns reported    No    Warm-up and Cool-down Performed on first and last piece of equipment    Resistance Training Performed Yes    VAD Patient? No    PAD/SET Patient? No      Pain Assessment   Currently in Pain? No/denies                Social History   Tobacco Use  Smoking Status Former   Types: Cigarettes   Start date: 01/07/1968   Quit date: 01/07/2000   Years since quitting: 22.4  Smokeless Tobacco Never    Goals Met:  Independence with exercise equipment Exercise tolerated well No report of concerns or symptoms today Strength training completed today  Goals Unmet:  Not Applicable  Comments: Pt able to follow exercise prescription today without complaint.  Will continue to monitor for progression.    Dr. Emily Filbert is Medical Director for [place].  Dr. Ottie Glazier is Medical Director for [facility] Pulmonary Rehabilitation.

## 2022-06-08 NOTE — Progress Notes (Signed)
Cardiac Individual Treatment Plan  Patient Details  Name: Jeremy Gilbert MRN: [id number] Date of Birth: [birth year] Referring Provider:   Flowsheet Row Cardiac Rehab from 04/28/2022 in [facility] Cardiac and Pulmonary Rehab  Referring Provider End, Harrell Gave MD       Initial Encounter Date:  Flowsheet Row Cardiac Rehab from 04/28/2022 in [facility] Cardiac and Pulmonary Rehab  Date 04/28/22       Visit Diagnosis: NSTEMI (non-ST elevation myocardial infarction) [facility])  Status post coronary artery stent placement  Patient's Home Medications on Admission:  Current Outpatient Medications:    acetaminophen (TYLENOL) 500 MG tablet, Take 500 mg by mouth every 6 (six) hours as needed for mild pain, moderate pain, fever or headache., Disp: , Rfl:    KRILL OIL PO, Take 350 mg by mouth 2 (two) times daily., Disp: , Rfl:    losartan-hydrochlorothiazide (HYZAAR) 50-12.5 MG tablet, Take 1 tablet by mouth daily., Disp: , Rfl:    metoprolol succinate (TOPROL-XL) 100 MG 24 hr tablet, Take 1 tablet by mouth 2 (two) times daily., Disp: , Rfl:    nitroGLYCERIN (NITROSTAT) 0.4 MG SL tablet, Place 1 tablet (0.4 mg total) under the tongue every 5 (five) minutes as needed for chest pain., Disp: 25 tablet, Rfl: PRN   ticagrelor (BRILINTA) 90 MG TABS tablet, Take 1 tablet (90 mg total) by mouth 2 (two) times daily., Disp: 180 tablet, Rfl: 0 No current facility-administered medications for this visit.  Facility-Administered Medications Ordered in Other Visits:    iohexol (OMNIPAQUE) 300 MG/ML solution, , , PRN, Callwood, Dwayne D, MD, 350 mL at 03/11/22 1610  Past Medical History: Past Medical History:  Diagnosis Date   Actinic keratosis    Basal cell carcinoma 2005   L alar crease   Coronary artery disease 03/2022   NSTEMI s/p PCI to RCA   Tobacco abuse 01/27/2015    Tobacco Use: Social History   Tobacco Use  Smoking Status Former   Types: Cigarettes   Start date: 01/07/1968   Quit date: 01/07/2000    Years since quitting: 22.4  Smokeless Tobacco Never    Labs: Review Flowsheet       Latest Ref Rng & Units 03/10/2022 03/11/2022  Labs for ITP Cardiac and Pulmonary Rehab  Cholestrol 0 - 200 mg/dL - 109   LDL (calc) 0 - 99 mg/dL - 46   HDL-C >40 mg/dL - 28   Trlycerides <150 mg/dL - 173   Hemoglobin A1c 4.8 - 5.6 % 6.1  -     Exercise Target Goals: Exercise Program Goal: Individual exercise prescription set using results from initial 6 min walk test and THRR while considering  patient's activity barriers and safety.   Exercise Prescription Goal: Initial exercise prescription builds to 30-45 minutes a day of aerobic activity, 2-3 days per week.  Home exercise guidelines will be given to patient during program as part of exercise prescription that the participant will acknowledge.   Education: Aerobic Exercise: - Group verbal and visual presentation on the components of exercise prescription. Introduces F.I.T.T principle from ACSM for exercise prescriptions.  Reviews F.I.T.T. principles of aerobic exercise including progression. Written material given at graduation.   Education: Resistance Exercise: - Group verbal and visual presentation on the components of exercise prescription. Introduces F.I.T.T principle from ACSM for exercise prescriptions  Reviews F.I.T.T. principles of resistance exercise including progression. Written material given at graduation.    Education: Exercise & Equipment Safety: - Individual verbal instruction and demonstration of equipment use and safety  with use of the equipment. Flowsheet Row Cardiac Rehab from 06/08/2022 in [facility] Cardiac and Pulmonary Rehab  Date 04/28/22  Educator [facility]  Instruction Review Code 1- Verbalizes Understanding       Education: Exercise Physiology & General Exercise Guidelines: - Group verbal and written instruction with models to review the exercise physiology of the cardiovascular system and associated critical values. Provides  general exercise guidelines with specific guidelines to those with heart or lung disease.  Flowsheet Row Cardiac Rehab from 06/08/2022 in [facility] Cardiac and Pulmonary Rehab  Date 06/08/22  Educator [facility]  Instruction Review Code 1- Verbalizes Understanding       Education: Flexibility, Balance, Mind/Body Relaxation: - Group verbal and visual presentation with interactive activity on the components of exercise prescription. Introduces F.I.T.T principle from ACSM for exercise prescriptions. Reviews F.I.T.T. principles of flexibility and balance exercise training including progression. Also discusses the mind body connection.  Reviews various relaxation techniques to help reduce and manage stress (i.e. Deep breathing, progressive muscle relaxation, and visualization). Balance handout provided to take home. Written material given at graduation.   Activity Barriers & Risk Stratification:  Activity Barriers & Cardiac Risk Stratification - 04/28/22 1251       Activity Barriers & Cardiac Risk Stratification   Activity Barriers Back Problems;Balance Concerns;Deconditioning   chronic back pain   Cardiac Risk Stratification Moderate             6 Minute Walk:  6 Minute Walk     Row Name 04/28/22 1250         6 Minute Walk   Phase Initial     Distance 1300 feet     Walk Time 6 minutes     # of Rest Breaks 0     MPH 2.46     METS 2.73     RPE 7     VO2 Peak 9.57     Symptoms No     Resting HR 77 bpm     Resting BP 134/62     Resting Oxygen Saturation  97 %     Exercise Oxygen Saturation  during 6 min walk 98 %     Max Ex. HR 90 bpm     Max Ex. BP 126/74     2 Minute Post BP 118/60              Oxygen Initial Assessment:   Oxygen Re-Evaluation:   Oxygen Discharge (Final Oxygen Re-Evaluation):   Initial Exercise Prescription:  Initial Exercise Prescription - 04/28/22 1200       Date of Initial Exercise RX and Referring Provider   Date 04/28/22    Referring Provider  End, Harrell Gave MD      Oxygen   Maintain Oxygen Saturation 88% or higher      Treadmill   MPH 2.3    Grade 1    Minutes 15    METs 3.12      NuStep   Level 3    SPM 80    Minutes 15    METs 2      Recumbant Elliptical   Level 1    RPM 50    Minutes 15    METs 2      Elliptical   Level 1    Speed 2.5    Minutes 15    METs 2      REL-XR   Level 2    Speed 50    Minutes 15    METs  2      Track   Laps 35    Minutes 15    METs 2.9      Prescription Details   Frequency (times per week) 3    Duration Progress to 30 minutes of continuous aerobic without signs/symptoms of physical distress      Intensity   THRR 40-80% of Max Heartrate 105-134    Ratings of Perceived Exertion 11-13    Perceived Dyspnea 0-4      Progression   Progression Continue to progress workloads to maintain intensity without signs/symptoms of physical distress.      Resistance Training   Training Prescription Yes    Weight 5 lb    Reps 10-15             Perform Capillary Blood Glucose checks as needed.  Exercise Prescription Changes:   Exercise Prescription Changes     Row Name 04/28/22 1200 05/17/22 1300 05/23/22 0800 05/30/22 1600       Response to Exercise   Blood Pressure (Admit) 134/62 122/76 -- 124/62    Blood Pressure (Exercise) 126/74 162/80 -- --    Blood Pressure (Exit) 118/60 110/60 -- 128/64    Heart Rate (Admit) 77 bpm 66 bpm -- 77 bpm    Heart Rate (Exercise) 90 bpm 100 bpm -- 108 bpm    Heart Rate (Exit) 68 bpm 87 bpm -- 84 bpm    Oxygen Saturation (Admit) 97 % -- -- --    Oxygen Saturation (Exercise) 98 % -- -- --    Rating of Perceived Exertion (Exercise) 7 15 -- 12    Symptoms none none -- none    Comments walk test results First two weeks of exercise -- --    Duration -- Continue with 30 min of aerobic exercise without signs/symptoms of physical distress. -- Continue with 30 min of aerobic exercise without signs/symptoms of physical distress.     Intensity -- THRR unchanged -- THRR unchanged      Progression   Progression -- Continue to progress workloads to maintain intensity without signs/symptoms of physical distress. -- Continue to progress workloads to maintain intensity without signs/symptoms of physical distress.    Average METs -- 2.76 -- 3.48      Resistance Training   Training Prescription -- Yes -- Yes    Weight -- 5 lb -- 5 lb    Reps -- 10-15 -- 10-15      Interval Training   Interval Training -- No -- No      Treadmill   MPH -- 3.4 -- 2.8    Grade -- 2 -- 2.5    Minutes -- 15 -- 15    METs -- 4.54 -- 4.11      Recumbant Elliptical   Level -- 2 -- 1.7    Minutes -- 15 -- 15    METs -- 2 -- 2      Elliptical   Level -- 1 -- --    Speed -- 2.5 -- --    Minutes -- 15 -- --    METs -- 2.2 -- --      REL-XR   Level -- 2 -- 4    Minutes -- 15 -- 15    METs -- -- -- 4.11      Home Exercise Plan   Plans to continue exercise at -- -- Home (comment)  walking, gardening Home (comment)  walking, gardening    Frequency -- -- Add 2 additional  days to program exercise sessions. Add 2 additional days to program exercise sessions.    Initial Home Exercises Provided -- -- 05/23/22 05/23/22      Oxygen   Maintain Oxygen Saturation -- 88% or higher -- 88% or higher             Exercise Comments:   Exercise Comments     Row Name 05/02/22 1035           Exercise Comments First full day of exercise!  Patient was oriented to gym and equipment including functions, settings, policies, and procedures.  Patient's individual exercise prescription and treatment plan were reviewed.  All starting workloads were established based on the results of the 6 minute walk test done at initial orientation visit.  The plan for exercise progression was also introduced and progression will be customized based on patient's performance and goals.                Exercise Goals and Review:   Exercise Goals     Row Name  04/28/22 1257             Exercise Goals   Increase Physical Activity Yes       Intervention Provide advice, education, support and counseling about physical activity/exercise needs.;Develop an individualized exercise prescription for aerobic and resistive training based on initial evaluation findings, risk stratification, comorbidities and participant's personal goals.       Expected Outcomes Short Term: Attend rehab on a regular basis to increase amount of physical activity.;Long Term: Add in home exercise to make exercise part of routine and to increase amount of physical activity.;Long Term: Exercising regularly at least 3-5 days a week.       Increase Strength and Stamina Yes       Intervention Provide advice, education, support and counseling about physical activity/exercise needs.;Develop an individualized exercise prescription for aerobic and resistive training based on initial evaluation findings, risk stratification, comorbidities and participant's personal goals.       Expected Outcomes Short Term: Increase workloads from initial exercise prescription for resistance, speed, and METs.;Short Term: Perform resistance training exercises routinely during rehab and add in resistance training at home;Long Term: Improve cardiorespiratory fitness, muscular endurance and strength as measured by increased METs and functional capacity (6MWT)       Able to understand and use rate of perceived exertion (RPE) scale Yes       Intervention Provide education and explanation on how to use RPE scale       Expected Outcomes Short Term: Able to use RPE daily in rehab to express subjective intensity level;Long Term:  Able to use RPE to guide intensity level when exercising independently       Able to understand and use Dyspnea scale Yes       Intervention Provide education and explanation on how to use Dyspnea scale       Expected Outcomes Short Term: Able to use Dyspnea scale daily in rehab to express  subjective sense of shortness of breath during exertion;Long Term: Able to use Dyspnea scale to guide intensity level when exercising independently       Knowledge and understanding of Target Heart Rate Range (THRR) Yes       Intervention Provide education and explanation of THRR including how the numbers were predicted and where they are located for reference       Expected Outcomes Short Term: Able to state/look up THRR;Short Term: Able to use daily as guideline for  intensity in rehab;Long Term: Able to use THRR to govern intensity when exercising independently       Able to check pulse independently Yes       Intervention Provide education and demonstration on how to check pulse in carotid and radial arteries.;Review the importance of being able to check your own pulse for safety during independent exercise       Expected Outcomes Short Term: Able to explain why pulse checking is important during independent exercise;Long Term: Able to check pulse independently and accurately       Understanding of Exercise Prescription Yes       Intervention Provide education, explanation, and written materials on patient's individual exercise prescription       Expected Outcomes Short Term: Able to explain program exercise prescription;Long Term: Able to explain home exercise prescription to exercise independently                Exercise Goals Re-Evaluation :  Exercise Goals Re-Evaluation     Row Name 05/02/22 1036 05/17/22 1359 05/23/22 0818 05/30/22 1624       Exercise Goal Re-Evaluation   Exercise Goals Review Increase Physical Activity;Increase Strength and Stamina;Able to understand and use rate of perceived exertion (RPE) scale;Able to understand and use Dyspnea scale;Knowledge and understanding of Target Heart Rate Range (THRR);Understanding of Exercise Prescription Increase Physical Activity;Increase Strength and Stamina;Understanding of Exercise Prescription Increase Physical Activity;Increase  Strength and Stamina;Understanding of Exercise Prescription;Able to understand and use rate of perceived exertion (RPE) scale;Knowledge and understanding of Target Heart Rate Range (THRR);Able to understand and use Dyspnea scale;Able to check pulse independently Increase Physical Activity;Increase Strength and Stamina;Understanding of Exercise Prescription    Comments Reviewed RPE and dyspnea scales, THR and program prescription with pt today.  Pt voiced understanding and was given a copy of goals to take home. Jeremy Gilbert is off to a good start in rehab. He increased his overall average MET level to 2.76 METs. He also increased his workload on the treadmill to a speed of 3.4 mph and an incline of 2%. He also has tolerated using 5 lb hand weights for resistance training. We will continue to monitor his progress in the program. Reviewed home exercise with pt today.  Pt plans to walk and do yard work for exercise.  Reviewed THR, pulse, RPE, sign and symptoms, pulse oximetery and when to call 911 or MD.  Also discussed weather considerations and indoor options.  Pt voiced understanding. Jeremy Gilbert continues to do well in rehab. He increased to level 4 on the XR and worked up over 4 METs on the treadmill. RPEs staying in appropriate range. We will continue to monitor.    Expected Outcomes Short: Use RPE daily to regulate intensity. Long: Follow program prescription in THR. Short: Continue to increase workloads. Long: Continue to increase strength and stamina. Short: Start to add in cardio at home not just yard work Long: Continue to improve stamina Short: increase level on REL Long: Continue to increase overall MET level             Discharge Exercise Prescription (Final Exercise Prescription Changes):  Exercise Prescription Changes - 05/30/22 1600       Response to Exercise   Blood Pressure (Admit) 124/62    Blood Pressure (Exit) 128/64    Heart Rate (Admit) 77 bpm    Heart Rate (Exercise) 108 bpm    Heart  Rate (Exit) 84 bpm    Rating of Perceived Exertion (Exercise) 12  Symptoms none    Duration Continue with 30 min of aerobic exercise without signs/symptoms of physical distress.    Intensity THRR unchanged      Progression   Progression Continue to progress workloads to maintain intensity without signs/symptoms of physical distress.    Average METs 3.48      Resistance Training   Training Prescription Yes    Weight 5 lb    Reps 10-15      Interval Training   Interval Training No      Treadmill   MPH 2.8    Grade 2.5    Minutes 15    METs 4.11      Recumbant Elliptical   Level 1.7    Minutes 15    METs 2      REL-XR   Level 4    Minutes 15    METs 4.11      Home Exercise Plan   Plans to continue exercise at Home (comment)   walking, gardening   Frequency Add 2 additional days to program exercise sessions.    Initial Home Exercises Provided 05/23/22      Oxygen   Maintain Oxygen Saturation 88% or higher             Nutrition:  Target Goals: Understanding of nutrition guidelines, daily intake of sodium <1534m, cholesterol <202m calories 30% from fat and 7% or less from saturated fats, daily to have 5 or more servings of fruits and vegetables.  Education: All About Nutrition: -Group instruction provided by verbal, written material, interactive activities, discussions, models, and posters to present general guidelines for heart healthy nutrition including fat, fiber, MyPlate, the role of sodium in heart healthy nutrition, utilization of the nutrition label, and utilization of this knowledge for meal planning. Follow up email sent as well. Written material given at graduation. [facility]  Education need identified 04/28/22       Biometrics:  Pre Biometrics - 04/28/22 1257       Pre Biometrics   Height 5' 6.4" (1.687 m)    Weight 163 lb 6.4 oz (74.1 kg)    Waist Circumference 37.5  inches    Hip Circumference 36 inches    Waist to Hip Ratio 1.04 %    BMI (Calculated) 26.04    Single Leg Stand 4.7 seconds              Nutrition Therapy Plan and Nutrition Goals:  Nutrition Therapy & Goals - 05/09/22 0835       Nutrition Therapy   RD appointment deferred Yes   Pt would not like to meet with RD at this time. Will continue to follow up.     Personal Nutrition Goals   Nutrition Goal Pt would not like to meet with RD at this time. Will continue to follow up.             Nutrition Assessments:  MEDIFICTS Score Key: ?70 Need to make dietary changes  40-70 Heart Healthy Diet ? 40 Therapeutic Level Cholesterol Diet  [facility]  Picture Your Plate Total Score on Admission 80      Picture Your Plate Scores: <4<76nhealthy dietary pattern with much room for improvement. 41-50 Dietary pattern unlikely to meet recommendations for good health and room for improvement. 51-60 More healthful dietary pattern, with some room for improvement.  >60 Healthy dietary pattern,  although there may be some specific behaviors that could be improved.    Nutrition Goals Re-Evaluation:  Nutrition Goals Re-Evaluation     Row Name 05/23/22 0802             Goals   Current Weight 163 lb (73.9 kg)       Nutrition Goal Pt would not like to meet with RD at this time. Will continue to follow up.                Nutrition Goals Discharge (Final Nutrition Goals Re-Evaluation):  Nutrition Goals Re-Evaluation - 05/23/22 0802       Goals   Current Weight 163 lb (73.9 kg)    Nutrition Goal Pt would not like to meet with RD at this time. Will continue to follow up.             Psychosocial: Target Goals: Acknowledge presence or absence of significant depression and/or stress, maximize coping skills, provide positive support system. Participant is able to verbalize types and ability to use  techniques and skills needed for reducing stress and depression.   Education: Stress, Anxiety, and Depression - Group verbal and visual presentation to define topics covered.  Reviews how body is impacted by stress, anxiety, and depression.  Also discusses healthy ways to reduce stress and to treat/manage anxiety and depression.  Written material given at graduation.   Education: Sleep Hygiene -Provides group verbal and written instruction about how sleep can affect your health.  Define sleep hygiene, discuss sleep cycles and impact of sleep habits. Review good sleep hygiene tips.    Initial Review & Psychosocial Screening:  Initial Psych Review & Screening - 03/29/22 0846       Initial Review   Current issues with Current Sleep Concerns    Comments Pt reports that he is not sleeping as well since he is not as active; he stopped taking his statin as it was making it even more difficult to sleep- he let his doctor know. He reports working around the house and the yard - and he helps his son with his job. He is also watching his grandchildren, gardening, and fishing; he is enjoying his retirment and is excited to go fishing this September. He walks about 0.25 miles a day in the morning as he wakes up around [place]? Yes   he relies on his wife for support as well of his son and daughter who live nearby.     Barriers   Psychosocial barriers to participate in program Psychosocial barriers identified (see note);The patient should benefit from training in stress management and relaxation.      Screening Interventions   Interventions Encouraged to exercise;Provide feedback about the scores to participant;To provide support and resources with identified psychosocial needs    Expected Outcomes Short Term goal: Utilizing psychosocial counselor, staff and physician to assist with identification of specific Stressors or current issues interfering with healing  process. Setting desired goal for each stressor or current issue identified.;Long Term Goal: Stressors or current issues are controlled or eliminated.;Short Term goal: Identification and review with participant of any Quality of Life or Depression concerns found by scoring the questionnaire.;Long Term goal: The participant improves quality of Life and PHQ9 Scores as seen by post scores and/or verbalization of changes             Quality of Life Scores:   Quality of Life - 04/28/22  1257       Quality of Life   Select Quality of Life      Quality of Life Scores   Health/Function Pre 28.4 %    Socioeconomic Pre 28.5 %    Psych/Spiritual Pre 30 %    Family Pre 24 %    GLOBAL Pre 28.11 %            Scores of 19 and below usually indicate a poorer quality of life in these areas.  A difference of  2-3 points is a clinically meaningful difference.  A difference of 2-3 points in the total score of the Quality of Life Index has been associated with significant improvement in overall quality of life, self-image, physical symptoms, and general health in studies assessing change in quality of life.  PHQ-9: Review Flowsheet       04/28/2022  Depression screen PHQ 2/9  Decreased Interest 0  Down, Depressed, Hopeless 0  PHQ - 2 Score 0  Altered sleeping 0  Tired, decreased energy 0  Change in appetite 0  Feeling bad or failure about yourself  0  Trouble concentrating 0  Moving slowly or fidgety/restless 0  Suicidal thoughts 0  PHQ-9 Score 0  Difficult doing work/chores Not difficult at all   Interpretation of Total Score  Total Score Depression Severity:  1-4 = Minimal depression, 5-9 = Mild depression, 10-14 = Moderate depression, 15-19 = Moderately severe depression, 20-27 = Severe depression   Psychosocial Evaluation and Intervention:  Psychosocial Evaluation - 03/29/22 0859       Psychosocial Evaluation & Interventions   Interventions Stress management education;Relaxation  education;Encouraged to exercise with the program and follow exercise prescription    Comments Pt reports that he is not sleeping as well since he is not as active; he stopped taking his statin as it was making it even more difficult to sleep- he let his doctor know. He reports working around the house and the yard - and he helps his son with his job. He is also watching his grandchildren, gardening, and fishing; he is enjoying his retirment and is excited to go fishing this September. He walks about 0.25 miles a day in the morning as he wakes up around 6am. He relies on his wife as well as his son and daughter who live close for support. He feels he is doing well since his heart event and he checks his BP daily; it is running around 110-120/60s.    Expected Outcomes ST: attend all scheduled exercise/education sessions, progress exercise prescription LT: continue to progress exercise prescription independently    Continue Psychosocial Services  Follow up required by staff             Psychosocial Re-Evaluation:  Psychosocial Re-Evaluation     [place] Name 05/23/22 0755             Psychosocial Re-Evaluation   Current issues with Current Sleep Concerns       Comments Jeremy Gilbert states that his mood is doing well. He is sleeping better now that the weather is better. His cardiologist says that his blood work and heart is looking good.       Expected Outcomes Short: Continue to attend [facility] regularly for regular exercise and social engagement. Long: Continue to improve symptoms and manage a positive mental state.       Interventions Encouraged to attend Cardiac Rehabilitation for the exercise       Continue Psychosocial Services  Follow up required by  staff                Psychosocial Discharge (Final Psychosocial Re-Evaluation):  Psychosocial Re-Evaluation - 05/23/22 0755       Psychosocial Re-Evaluation   Current issues with Current Sleep Concerns    Comments Jeremy Gilbert states that his  mood is doing well. He is sleeping better now that the weather is better. His cardiologist says that his blood work and heart is looking good.    Expected Outcomes Short: Continue to attend [facility] regularly for regular exercise and social engagement. Long: Continue to improve symptoms and manage a positive mental state.    Interventions Encouraged to attend Cardiac Rehabilitation for the exercise    Continue Psychosocial Services  Follow up required by staff             Vocational Rehabilitation: Provide vocational rehab assistance to qualifying candidates.   Vocational Rehab Evaluation & Intervention:  Vocational Rehab - 03/29/22 6226       Initial Vocational Rehab Evaluation & Intervention   Assessment shows need for Vocational Rehabilitation No      Vocational Rehab Re-Evaulation   Comments Retired             Education: Education Goals: Education classes will be provided on a variety of topics geared toward better understanding of heart health and risk factor modification. Participant will state understanding/return demonstration of topics presented as noted by education test scores.  Learning Barriers/Preferences:  Learning Barriers/Preferences - 03/29/22 0908       Learning Barriers/Preferences   Learning Barriers None    Learning Preferences None             General Cardiac Education Topics:  AED/CPR: - Group verbal and written instruction with the use of models to demonstrate the basic use of the AED with the basic ABC's of resuscitation.   Anatomy and Cardiac Procedures: - Group verbal and visual presentation and models provide information about basic cardiac anatomy and function. Reviews the testing methods done to diagnose heart disease and the outcomes of the test results. Describes the treatment choices: Medical Management, Angioplasty, or Coronary Bypass Surgery for treating various heart conditions including Myocardial Infarction, Angina, Valve  Disease, and Cardiac Arrhythmias.  Written material given at graduation.   Medication Safety: - Group verbal and visual instruction to review commonly prescribed medications for heart and lung disease. Reviews the medication, class of the drug, and side effects. Includes the steps to properly store meds and maintain the prescription regimen.  Written material given at graduation. Flowsheet Row Cardiac Rehab from 06/08/2022 in [facility] Cardiac and Pulmonary Rehab  Date 05/12/22  Educator SB  Instruction Review Code 1- Verbalizes Understanding       Intimacy: - Group verbal instruction through game format to discuss how heart and lung disease can affect sexual intimacy. Written material given at graduation..   Know Your Numbers and Heart Failure: - Group verbal and visual instruction to discuss disease risk factors for cardiac and pulmonary disease and treatment options.  Reviews associated critical values for Overweight/Obesity, Hypertension, Cholesterol, and Diabetes.  Discusses basics of heart failure: signs/symptoms and treatments.  Introduces Heart Failure Zone chart for action plan for heart failure.  Written material given at graduation.   Infection Prevention: - Provides verbal and written material to individual with discussion of infection control including proper hand washing and proper equipment cleaning during exercise session. Flowsheet Row Cardiac Rehab from 06/08/2022 in [facility] Cardiac and Pulmonary Rehab  Date 04/28/22  Educator [facility]  Instruction Review Code 1- Verbalizes Understanding       Falls Prevention: - Provides verbal and written material to individual with discussion of falls prevention and safety. Flowsheet Row Cardiac Rehab from 06/08/2022 in [facility] Cardiac and Pulmonary Rehab  Education need identified 03/29/22  Date 03/29/22  Educator [place]  Instruction Review Code 1- Verbalizes Understanding       Other: -Provides group and verbal instruction on various topics  (see comments)   Knowledge Questionnaire Score:  Knowledge Questionnaire Score - 04/28/22 1258       Knowledge Questionnaire Score   Pre Score 25/26             Core Components/Risk Factors/Patient Goals at Admission:  Personal Goals and Risk Factors at Admission - 04/28/22 1259       Core Components/Risk Factors/Patient Goals on Admission    Weight Management Yes;Weight Loss    Intervention Weight Management: Develop a combined nutrition and exercise program designed to reach desired caloric intake, while maintaining appropriate intake of nutrient and fiber, sodium and fats, and appropriate energy expenditure required for the weight goal.;Weight Management: Provide education and appropriate resources to help participant work on and attain dietary goals.    Admit Weight 163 lb 6.4 oz (74.1 kg)    Goal Weight: Short Term 155 lb (70.3 kg)    Goal Weight: Long Term 155 lb (70.3 kg)    Expected Outcomes Short Term: Continue to assess and modify interventions until short term weight is achieved;Long Term: Adherence to nutrition and physical activity/exercise program aimed toward attainment of established weight goal;Understanding recommendations for meals to include 15-35% energy as protein, 25-35% energy from fat, 35-60% energy from carbohydrates, less than 273m of dietary cholesterol, 20-35 gm of total fiber daily;Understanding of distribution of calorie intake throughout the day with the consumption of 4-5 meals/snacks;Weight Loss: Understanding of general recommendations for a balanced deficit meal plan, which promotes 1-2 lb weight loss per week and includes a negative energy balance of [phone] kcal/d    Diabetes Yes   diet controlled   Intervention Provide education about signs/symptoms and action to take for hypo/hyperglycemia.;Provide education about proper nutrition, including hydration, and aerobic/resistive exercise prescription along with prescribed medications to achieve blood  glucose in normal ranges: Fasting glucose 65-99 mg/dL    Expected Outcomes Short Term: Participant verbalizes understanding of the signs/symptoms and immediate care of hyper/hypoglycemia, proper foot care and importance of medication, aerobic/resistive exercise and nutrition plan for blood glucose control.;Long Term: Attainment of HbA1C < 7%.    Hypertension Yes    Intervention Provide education on lifestyle modifcations including regular physical activity/exercise, weight management, moderate sodium restriction and increased consumption of fresh fruit, vegetables, and low fat dairy, alcohol moderation, and smoking cessation.;Monitor prescription use compliance.    Expected Outcomes Short Term: Continued assessment and intervention until BP is < 140/984mHG in hypertensive participants. < 130/8043mG in hypertensive participants with diabetes, heart failure or chronic kidney disease.;Long Term: Maintenance of blood pressure at goal levels.    Lipids Yes    Intervention Provide education and support for participant on nutrition & aerobic/resistive exercise along with prescribed medications to achieve LDL <54m72mDL >40mg61m Expected Outcomes Short Term: Participant states understanding of desired cholesterol values and is compliant with medications prescribed. Participant is following exercise prescription and nutrition guidelines.;Long Term: Cholesterol controlled with medications as prescribed, with individualized exercise RX and with personalized nutrition plan. Value goals: LDL < 54mg,2m > 40  mg.             Education:Diabetes - Individual verbal and written instruction to review signs/symptoms of diabetes, desired ranges of glucose level fasting, after meals and with exercise. Acknowledge that pre and post exercise glucose checks will be done for 3 sessions at entry of program.   Core Components/Risk Factors/Patient Goals Review:   Goals and Risk Factor Review     Row Name 05/23/22 0759              Core Components/Risk Factors/Patient Goals Review   Personal Goals Review Weight Management/Obesity;Lipids       Review Jeremy Gilbert states that he cannot sleep with statins. His cardiologist wants him to be on statins but he is not taking any since it makes him gasp for air when he sleeps. He will continue to monitor is lipids and follow up with his cardiologist.       Expected Outcomes Short: maintain lipid readings. Long: have an adequate levels of cholesterol.                Core Components/Risk Factors/Patient Goals at Discharge (Final Review):   Goals and Risk Factor Review - 05/23/22 0759       Core Components/Risk Factors/Patient Goals Review   Personal Goals Review Weight Management/Obesity;Lipids    Review Jeremy Gilbert states that he cannot sleep with statins. His cardiologist wants him to be on statins but he is not taking any since it makes him gasp for air when he sleeps. He will continue to monitor is lipids and follow up with his cardiologist.    Expected Outcomes Short: maintain lipid readings. Long: have an adequate levels of cholesterol.             ITP Comments:  ITP Comments     Row Name 03/29/22 0906 04/28/22 1245 05/02/22 1035 05/11/22 0758 06/08/22 1027   ITP Comments Virtual orientation call completed today. He has an appointment on Date: 04/04/22  for EP eval and gym Orientation.  Documentation of diagnosis can be found in [facility] Date: 03/11/22 . Completed 6MWT and gym orientation. Initial ITP created and sent for review to Dr. Emily Filbert, Medical Director. First full day of exercise!  Patient was oriented to gym and equipment including functions, settings, policies, and procedures.  Patient's individual exercise prescription and treatment plan were reviewed.  All starting workloads were established based on the results of the 6 minute walk test done at initial orientation visit.  The plan for exercise progression was also introduced and progression will be  customized based on patient's performance and goals. 30 Day review completed. Medical Director ITP review done, changes made as directed, and signed approval by Medical Director.    New to program 30 Day review completed. Medical Director ITP review done, changes made as directed, and signed approval by Medical Director.            Comments:

## 2022-06-14 ENCOUNTER — Encounter: Payer: Medicare Other | Admitting: *Deleted

## 2022-06-14 DIAGNOSIS — I214 Non-ST elevation (NSTEMI) myocardial infarction: Secondary | ICD-10-CM | POA: Diagnosis not present

## 2022-06-14 DIAGNOSIS — Z955 Presence of coronary angioplasty implant and graft: Secondary | ICD-10-CM

## 2022-06-14 NOTE — Progress Notes (Signed)
Daily Session Note  Patient Details  Name: Jeremy Gilbert MRN: [id number] Date of Birth: [birth year] Referring Provider:   Flowsheet Row Cardiac Rehab from 04/28/2022 in [facility] Cardiac and Pulmonary Rehab  Referring Provider End, Harrell Gave MD       Encounter Date: 06/14/2022  Check In:  Session Check In - 06/14/22 0830       Check-In   Supervising physician immediately available to respond to emergencies See telemetry face sheet for immediately available ER MD    Location [facility]-Cardiac & Pulmonary Rehab    Staff Present Heath Lark, RN, BSN, CCRP;Jessica Waller, MA, RCEP, CCRP, Marylynn Pearson, MS, ASCM CEP, Exercise Physiologist    Virtual Visit No    Medication changes reported     No    Fall or balance concerns reported    No    Warm-up and Cool-down Performed on first and last piece of equipment    Resistance Training Performed Yes    VAD Patient? No    PAD/SET Patient? No      Pain Assessment   Currently in Pain? No/denies                Social History   Tobacco Use  Smoking Status Former   Types: Cigarettes   Start date: 01/07/1968   Quit date: 01/07/2000   Years since quitting: 22.4  Smokeless Tobacco Never    Goals Met:  Independence with exercise equipment Exercise tolerated well No report of concerns or symptoms today  Goals Unmet:  Not Applicable  Comments: Pt able to follow exercise prescription today without complaint.  Will continue to monitor for progression.    Dr. Emily Filbert is Medical Director for [place].  Dr. Ottie Glazier is Medical Director for [facility] Pulmonary Rehabilitation.

## 2022-06-15 ENCOUNTER — Encounter: Payer: Medicare Other | Admitting: *Deleted

## 2022-06-15 DIAGNOSIS — Z955 Presence of coronary angioplasty implant and graft: Secondary | ICD-10-CM

## 2022-06-15 DIAGNOSIS — I214 Non-ST elevation (NSTEMI) myocardial infarction: Secondary | ICD-10-CM

## 2022-06-15 NOTE — Progress Notes (Signed)
Daily Session Note  Patient Details  Name: Jeremy Gilbert MRN: [id number] Date of Birth: 02-Feb-1951 Referring Provider:   Flowsheet Row Cardiac Rehab from 04/28/2022 in [facility] Cardiac and Pulmonary Rehab  Referring Provider End, Harrell Gave MD       Encounter Date: 06/15/2022  Check In:  Session Check In - 06/15/22 0828       Check-In   Supervising physician immediately available to respond to emergencies See telemetry face sheet for immediately available ER MD    Location [facility]-Cardiac & Pulmonary Rehab    Staff Present Antionette Fairy, BS, Exercise Physiologist;Elmon Rosebud Poles, RN, [place]    Virtual Visit No    Medication changes reported     No    Fall or balance concerns reported    No    Warm-up and Cool-down Performed on first and last piece of equipment    Resistance Training Performed Yes    VAD Patient? No    PAD/SET Patient? No      Pain Assessment   Currently in Pain? No/denies                Social History   Tobacco Use  Smoking Status Former   Types: Cigarettes   Start date: 01/07/1968   Quit date: 01/07/2000   Years since quitting: 22.4  Smokeless Tobacco Never    Goals Met:  Independence with exercise equipment Exercise tolerated well No report of concerns or symptoms today Strength training completed today  Goals Unmet:  Not Applicable  Comments: Pt able to follow exercise prescription today without complaint.  Will continue to monitor for progression.    Dr. Emily Filbert is Medical Director for [place].  Dr. Ottie Glazier is Medical Director for [facility] Pulmonary Rehabilitation.

## 2022-06-17 ENCOUNTER — Encounter: Payer: Medicare Other | Admitting: *Deleted

## 2022-06-17 DIAGNOSIS — Z955 Presence of coronary angioplasty implant and graft: Secondary | ICD-10-CM

## 2022-06-17 DIAGNOSIS — I214 Non-ST elevation (NSTEMI) myocardial infarction: Secondary | ICD-10-CM | POA: Diagnosis not present

## 2022-06-17 NOTE — Progress Notes (Signed)
Daily Session Note  Patient Details  Name: SALAAM BATTERSHELL MRN: [id number] Date of Birth: [birth year] Referring Provider:   Flowsheet Row Cardiac Rehab from 04/28/2022 in [facility] Cardiac and Pulmonary Rehab  Referring Provider End, Harrell Gave MD       Encounter Date: 06/17/2022  Check In:  Session Check In - 06/17/22 0914       Check-In   Supervising physician immediately available to respond to emergencies See telemetry face sheet for immediately available ER MD    Location [facility]-Cardiac & Pulmonary Rehab    Staff Present Heath Lark, RN, BSN, CCRP;Jessica Solana, MA, RCEP, CCRP, CCET;Jovanie [place], [place]    Virtual Visit No    Medication changes reported     No    Fall or balance concerns reported    No    Warm-up and Cool-down Performed on first and last piece of equipment    Resistance Training Performed Yes    VAD Patient? No    PAD/SET Patient? No      Pain Assessment   Currently in Pain? No/denies                Social History   Tobacco Use  Smoking Status Former   Types: Cigarettes   Start date: 01/07/1968   Quit date: 01/07/2000   Years since quitting: 22.4  Smokeless Tobacco Never    Goals Met:  Independence with exercise equipment Exercise tolerated well No report of concerns or symptoms today  Goals Unmet:  Not Applicable  Comments: Pt able to follow exercise prescription today without complaint.  Will continue to monitor for progression.    Dr. Emily Filbert is Medical Director for [place].  Dr. Ottie Glazier is Medical Director for [facility] Pulmonary Rehabilitation.

## 2022-06-21 ENCOUNTER — Telehealth: Payer: Self-pay | Admitting: Internal Medicine

## 2022-06-21 ENCOUNTER — Encounter: Payer: Medicare Other | Admitting: *Deleted

## 2022-06-21 DIAGNOSIS — I214 Non-ST elevation (NSTEMI) myocardial infarction: Secondary | ICD-10-CM | POA: Diagnosis not present

## 2022-06-21 DIAGNOSIS — Z955 Presence of coronary angioplasty implant and graft: Secondary | ICD-10-CM

## 2022-06-21 NOTE — Telephone Encounter (Signed)
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  Nelva Bush, MD [facility]

## 2022-06-21 NOTE — Progress Notes (Signed)
Daily Session Note  Patient Details  Name: Jeremy Gilbert MRN: [id number] Date of Birth: 12/03/1950 Referring Provider:   Flowsheet Row Cardiac Rehab from 04/28/2022 in [facility] Cardiac and Pulmonary Rehab  Referring Provider End, Christopher MD       Encounter Date: 06/21/2022  Check In:  Session Check In - 06/21/22 0838       Check-In   Supervising physician immediately available to respond to emergencies See telemetry face sheet for immediately available ER MD    Location [facility]-Cardiac & Pulmonary Rehab    Staff Present Susanne Bice, RN, BSN, CCRP;Jessica Hawkins, MA, RCEP, CCRP, CCET;Kara Langdon, MS, ASCM CEP, Exercise Physiologist    Virtual Visit No    Medication changes reported     No    Fall or balance concerns reported    No    Warm-up and Cool-down Performed on first and last piece of equipment    Resistance Training Performed Yes    VAD Patient? No    PAD/SET Patient? No      Pain Assessment   Currently in Pain? No/denies                Social History   Tobacco Use  Smoking Status Former   Types: Cigarettes   Start date: 01/07/1968   Quit date: 01/07/2000   Years since quitting: 22.4  Smokeless Tobacco Never    Goals Met:  Independence with exercise equipment Exercise tolerated well No report of concerns or symptoms today  Goals Unmet:  Not Applicable  Comments: Pt able to follow exercise prescription today without complaint.  Will continue to monitor for progression.    Dr. Mark Miller is Medical Director for [facility] Cardiac [facility].  Dr. Fuad Aleskerov is Medical Director for [facility] Pulmonary Rehabilitation. 

## 2022-06-21 NOTE — Telephone Encounter (Signed)
Pt c/o medication issue:  1. Name of Medication: Zetia or Repatha   2. How are you currently taking this medication (dosage and times per day)?   3. Are you having a reaction (difficulty breathing--STAT)?   4. What is your medication issue? Pt PCP office called because Dr.Bronstein would like to start pt on one of these medications since pt is allergic to statins and they would like Dr.End's thoughts on this.

## 2022-06-22 ENCOUNTER — Encounter: Payer: Medicare Other | Admitting: *Deleted

## 2022-06-22 DIAGNOSIS — I214 Non-ST elevation (NSTEMI) myocardial infarction: Secondary | ICD-10-CM | POA: Diagnosis not present

## 2022-06-22 NOTE — Progress Notes (Signed)
Daily Session Note  Patient Details  Name: Jeremy Gilbert MRN: [id number] Date of Birth: 1950-12-18 Referring Provider:   Flowsheet Row Cardiac Rehab from 04/28/2022 in [facility] Cardiac and Pulmonary Rehab  Referring Provider End, Harrell Gave MD       Encounter Date: 06/22/2022  Check In:  Session Check In - 06/22/22 0752       Check-In   Supervising physician immediately available to respond to emergencies See telemetry face sheet for immediately available ER MD    Location [facility]-Cardiac & Pulmonary Rehab    Staff Present Justin Mend, Ernestina Patches, RN, Dimple Nanas, BS, Exercise Physiologist    Virtual Visit No    Medication changes reported     No    Fall or balance concerns reported    No    Warm-up and Cool-down Performed on first and last piece of equipment    Resistance Training Performed Yes    VAD Patient? No    PAD/SET Patient? No      Pain Assessment   Currently in Pain? No/denies                Social History   Tobacco Use  Smoking Status Former   Types: Cigarettes   Start date: 01/07/1968   Quit date: 01/07/2000   Years since quitting: 22.4  Smokeless Tobacco Never    Goals Met:  Independence with exercise equipment Exercise tolerated well No report of concerns or symptoms today Strength training completed today  Goals Unmet:  Not Applicable  Comments: Pt able to follow exercise prescription today without complaint.  Will continue to monitor for progression.    Dr. Emily Filbert is Medical Director for [place].  Dr. Ottie Glazier is Medical Director for [facility] Pulmonary Rehabilitation.

## 2022-06-22 NOTE — Telephone Encounter (Signed)
Attempted to contact  Ailene Ravel from Dr. Lovie Macadamia office. Left message to call back.

## 2022-06-23 NOTE — Telephone Encounter (Signed)
Dr. Reuel Boom office made aware of recommendations and verbalized understanding.

## 2022-06-24 ENCOUNTER — Encounter: Payer: Medicare Other | Admitting: *Deleted

## 2022-06-24 DIAGNOSIS — Z955 Presence of coronary angioplasty implant and graft: Secondary | ICD-10-CM

## 2022-06-24 DIAGNOSIS — I214 Non-ST elevation (NSTEMI) myocardial infarction: Secondary | ICD-10-CM

## 2022-06-24 NOTE — Progress Notes (Signed)
Daily Session Note  Patient Details  Name: Jeremy Gilbert MRN: [id number] Date of Birth: [birth year] Referring Provider:   Flowsheet Row Cardiac Rehab from 04/28/2022 in [facility] Cardiac and Pulmonary Rehab  Referring Provider End, Harrell Gave MD       Encounter Date: 06/24/2022  Check In:  Session Check In - 06/24/22 0755       Check-In   Supervising physician immediately available to respond to emergencies See telemetry face sheet for immediately available ER MD    Location [facility]-Cardiac & Pulmonary Rehab    Staff Present Alberteen Sam, MA, RCEP, CCRP, CCET;Ryon [place], [place], RN, [place]    Virtual Visit No    Medication changes reported     No    Fall or balance concerns reported    No    Warm-up and Cool-down Performed on first and last piece of equipment    Resistance Training Performed Yes    VAD Patient? No    PAD/SET Patient? No      Pain Assessment   Currently in Pain? No/denies                Social History   Tobacco Use  Smoking Status Former   Types: Cigarettes   Start date: 01/07/1968   Quit date: 01/07/2000   Years since quitting: 22.4  Smokeless Tobacco Never    Goals Met:  Independence with exercise equipment Exercise tolerated well No report of concerns or symptoms today Strength training completed today  Goals Unmet:  Not Applicable  Comments: Pt able to follow exercise prescription today without complaint.  Will continue to monitor for progression.    Dr. Emily Filbert is Medical Director for [place].  Dr. Ottie Glazier is Medical Director for [facility] Pulmonary Rehabilitation.

## 2022-06-27 ENCOUNTER — Encounter: Payer: Medicare Other | Admitting: *Deleted

## 2022-06-27 DIAGNOSIS — I214 Non-ST elevation (NSTEMI) myocardial infarction: Secondary | ICD-10-CM

## 2022-06-27 NOTE — Progress Notes (Signed)
Daily Session Note  Patient Details  Name: AGASTYA MEISTER MRN: [id number] Date of Birth: 1951-05-10 Referring Provider:   Flowsheet Row Cardiac Rehab from 04/28/2022 in [facility] Cardiac and Pulmonary Rehab  Referring Provider End, Harrell Gave MD       Encounter Date: 06/27/2022  Check In:  Session Check In - 06/27/22 0748       Check-In   Supervising physician immediately available to respond to emergencies See telemetry face sheet for immediately available ER MD    Location [facility]-Cardiac & Pulmonary Rehab    Staff Present Alberteen Sam, MA, RCEP, CCRP, Mindi Curling, RN, Doyce Para, BS, ACSM CEP, Exercise Physiologist    Virtual Visit No    Medication changes reported     No    Fall or balance concerns reported    No    Warm-up and Cool-down Performed on first and last piece of equipment    Resistance Training Performed Yes    VAD Patient? No    PAD/SET Patient? No      Pain Assessment   Currently in Pain? No/denies                Social History   Tobacco Use  Smoking Status Former   Types: Cigarettes   Start date: 01/07/1968   Quit date: 01/07/2000   Years since quitting: 22.4  Smokeless Tobacco Never    Goals Met:  Independence with exercise equipment Exercise tolerated well No report of concerns or symptoms today Strength training completed today  Goals Unmet:  Not Applicable  Comments: Pt able to follow exercise prescription today without complaint.  Will continue to monitor for progression.    Dr. Emily Filbert is Medical Director for [place].  Dr. Ottie Glazier is Medical Director for [facility] Pulmonary Rehabilitation.

## 2022-06-28 ENCOUNTER — Encounter: Payer: Medicare Other | Admitting: *Deleted

## 2022-06-28 VITALS — Ht 66.4 in | Wt 163.8 lb

## 2022-06-28 DIAGNOSIS — I214 Non-ST elevation (NSTEMI) myocardial infarction: Secondary | ICD-10-CM

## 2022-06-28 DIAGNOSIS — Z955 Presence of coronary angioplasty implant and graft: Secondary | ICD-10-CM

## 2022-06-28 NOTE — Patient Instructions (Signed)
Discharge Patient Instructions  Patient Details  Name: Jeremy Gilbert MRN: [id number] Date of Birth: 27-Dec-1950 Referring Provider:  Juluis Pitch, MD   Number of Visits: 79  Reason for Discharge:  Patient reached a stable level of exercise. Patient independent in their exercise. Patient has met program and personal goals.  Smoking History:  Social History   Tobacco Use  Smoking Status Former   Types: Cigarettes   Start date: 01/07/1968   Quit date: 01/07/2000   Years since quitting: 22.4  Smokeless Tobacco Never    Diagnosis:  NSTEMI (non-ST elevation myocardial infarction) ([place])  Status post coronary artery stent placement  Initial Exercise Prescription:  Initial Exercise Prescription - 04/28/22 1200       Date of Initial Exercise RX and Referring Provider   Date 04/28/22    Referring Provider End, Harrell Gave MD      Oxygen   Maintain Oxygen Saturation 88% or higher      Treadmill   MPH 2.3    Grade 1    Minutes 15    METs 3.12      NuStep   Level 3    SPM 80    Minutes 15    METs 2      Recumbant Elliptical   Level 1    RPM 50    Minutes 15    METs 2      Elliptical   Level 1    Speed 2.5    Minutes 15    METs 2      REL-XR   Level 2    Speed 50    Minutes 15    METs 2      Track   Laps 35    Minutes 15    METs 2.9      Prescription Details   Frequency (times per week) 3    Duration Progress to 30 minutes of continuous aerobic without signs/symptoms of physical distress      Intensity   THRR 40-80% of Max Heartrate 105-134    Ratings of Perceived Exertion 11-13    Perceived Dyspnea 0-4      Progression   Progression Continue to progress workloads to maintain intensity without signs/symptoms of physical distress.      Resistance Training   Training Prescription Yes    Weight 5 lb    Reps 10-15             Discharge Exercise Prescription (Final Exercise Prescription Changes):  Exercise Prescription Changes -  06/27/22 1400       Response to Exercise   Blood Pressure (Admit) 118/60    Blood Pressure (Exit) 112/72    Heart Rate (Admit) 70 bpm    Heart Rate (Exercise) 91 bpm    Heart Rate (Exit) 86 bpm    Rating of Perceived Exertion (Exercise) 12    Symptoms none    Duration Continue with 30 min of aerobic exercise without signs/symptoms of physical distress.    Intensity THRR unchanged      Progression   Progression Continue to progress workloads to maintain intensity without signs/symptoms of physical distress.    Average METs 3.86      Resistance Training   Training Prescription Yes    Weight 5 lb    Reps 10-15      Interval Training   Interval Training No      Treadmill   MPH 2.5    Grade 6    Minutes 15  METs 4.98      Recumbant Elliptical   Level 1.4    Minutes 15    METs 1.9      REL-XR   Level 6    Minutes 15    METs 4.9      Home Exercise Plan   Plans to continue exercise at Home (comment)   walking, gardening   Frequency Add 2 additional days to program exercise sessions.    Initial Home Exercises Provided 05/23/22      Oxygen   Maintain Oxygen Saturation 88% or higher             Functional Capacity:  6 Minute Walk     Row Name 04/28/22 1250 06/28/22 0834       6 Minute Walk   Phase Initial Discharge    Distance 1300 feet 1435 feet    Distance % Change -- 10.4 %    Distance Feet Change -- 135 ft    Walk Time 6 minutes 6 minutes    # of Rest Breaks 0 0    MPH 2.46 2.71    METS 2.73 2.91    RPE 7 11    VO2 Peak 9.57 10.18    Symptoms No No    Resting HR 77 bpm 71 bpm    Resting BP 134/62 124/58    Resting Oxygen Saturation  97 % 97 %    Exercise Oxygen Saturation  during 6 min walk 98 % --    Max Ex. HR 90 bpm 86 bpm    Max Ex. BP 126/74 124/54    2 Minute Post BP 118/60 --                Personal Goals Discharge:  Goals and Risk Factor Review - 06/14/22 0800       Core Components/Risk Factors/Patient Goals Review    Personal Goals Review Weight Management/Obesity;Lipids;Diabetes;Hypertension    Review Jeremy Gilbert is doing well in rehab. His weight goes up and down but never by more than 2 lb so he is fairly steady with it.  He is doing well with his sugars as he had not had any swings.  He had blood work done yesterday with follow up on Monday.  His pressures are doing well and he continues to check at home.    Expected Outcomes Short; Follow up on blood work with doctor Long: Conitnue to montior risk factors.             Nutrition & Weight - Outcomes:  Pre Biometrics - 04/28/22 1257       Pre Biometrics   Height 5' 6.4" (1.687 m)    Weight 163 lb 6.4 oz (74.1 kg)    Waist Circumference 37.5 inches    Hip Circumference 36 inches    Waist to Hip Ratio 1.04 %    BMI (Calculated) 26.04    Single Leg Stand 4.7 seconds             Post Biometrics - 06/28/22 0835        Post  Biometrics   Height 5' 6.4" (1.687 m)    Weight 163 lb 12.8 oz (74.3 kg)    BMI (Calculated) 26.11    Single Leg Stand 18.2 seconds            Goals reviewed with patient; copy given to patient.

## 2022-06-28 NOTE — Progress Notes (Signed)
Daily Session Note  Patient Details  Name: Jeremy Gilbert MRN: [id number] Date of Birth: 1951/05/14 Referring Provider:   Flowsheet Row Cardiac Rehab from 04/28/2022 in [facility] Cardiac and Pulmonary Rehab  Referring Provider End, Harrell Gave MD       Encounter Date: 06/28/2022  Check In:  Session Check In - 06/28/22 0835       Check-In   Supervising physician immediately available to respond to emergencies See telemetry face sheet for immediately available ER MD    Location [facility]-Cardiac & Pulmonary Rehab    Staff Present Heath Lark, RN, BSN, Jacklynn Bue, MS, ASCM CEP, Exercise Physiologist;Jessica [place], MA, RCEP, CCRP, CCET    Virtual Visit No    Medication changes reported     Yes    Comments added Repatha 140 mg    Fall or balance concerns reported    No    Warm-up and Cool-down Performed on first and last piece of equipment    Resistance Training Performed Yes    VAD Patient? No    PAD/SET Patient? No      Pain Assessment   Currently in Pain? No/denies              6 Minute Walk     Row Name 04/28/22 1250 06/28/22 0834       6 Minute Walk   Phase Initial Discharge    Distance 1300 feet 1435 feet    Distance % Change -- 10.4 %    Distance Feet Change -- 135 ft    Walk Time 6 minutes 6 minutes    # of Rest Breaks 0 0    MPH 2.46 2.71    METS 2.73 2.91    RPE 7 11    VO2 Peak 9.57 10.18    Symptoms No No    Resting HR 77 bpm 71 bpm    Resting BP 134/62 124/58    Resting Oxygen Saturation  97 % 97 %    Exercise Oxygen Saturation  during 6 min walk 98 % --    Max Ex. HR 90 bpm 86 bpm    Max Ex. BP 126/74 124/54    2 Minute Post BP 118/60 --               Social History   Tobacco Use  Smoking Status Former   Types: Cigarettes   Start date: 01/07/1968   Quit date: 01/07/2000   Years since quitting: 22.4  Smokeless Tobacco Never    Goals Met:  Independence with exercise equipment Exercise tolerated well No report of concerns or  symptoms today  Goals Unmet:  Not Applicable  Comments: Pt able to follow exercise prescription today without complaint.  Will continue to monitor for progression.    Dr. Emily Filbert is Medical Director for [place].  Dr. Ottie Glazier is Medical Director for [facility] Pulmonary Rehabilitation.

## 2022-06-29 ENCOUNTER — Encounter: Payer: Medicare Other | Admitting: *Deleted

## 2022-06-29 DIAGNOSIS — I214 Non-ST elevation (NSTEMI) myocardial infarction: Secondary | ICD-10-CM

## 2022-06-29 DIAGNOSIS — Z955 Presence of coronary angioplasty implant and graft: Secondary | ICD-10-CM

## 2022-06-29 NOTE — Progress Notes (Signed)
Daily Session Note  Patient Details  Name: Jeremy Gilbert MRN: [id number] Date of Birth: [birth year] Referring Provider:   Flowsheet Row Cardiac Rehab from 04/28/2022 in [facility] Cardiac and Pulmonary Rehab  Referring Provider End, Harrell Gave MD       Encounter Date: 06/29/2022  Check In:  Session Check In - 06/29/22 0758       Check-In   Supervising physician immediately available to respond to emergencies See telemetry face sheet for immediately available ER MD    Location [facility]-Cardiac & Pulmonary Rehab    Staff Present Darlyne Russian, RN, ADN;Susanne Bice, RN, BSN, CCRP;Noah Tickle, BS, Exercise Physiologist;Kara Eliezer Bottom, MS, ASCM CEP, Exercise Physiologist    Virtual Visit No    Medication changes reported     No    Fall or balance concerns reported    No    Warm-up and Cool-down Performed on first and last piece of equipment    Resistance Training Performed Yes    VAD Patient? No    PAD/SET Patient? No      Pain Assessment   Currently in Pain? No/denies                Social History   Tobacco Use  Smoking Status Former   Types: Cigarettes   Start date: 01/07/1968   Quit date: 01/07/2000   Years since quitting: 22.4  Smokeless Tobacco Never    Goals Met:  Independence with exercise equipment Exercise tolerated well No report of concerns or symptoms today Strength training completed today  Goals Unmet:  Not Applicable  Comments: Pt able to follow exercise prescription today without complaint.  Will continue to monitor for progression.    Dr. Emily Filbert is Medical Director for [place].  Dr. Ottie Glazier is Medical Director for [facility] Pulmonary Rehabilitation.

## 2022-07-04 ENCOUNTER — Encounter: Payer: Medicare Other | Admitting: *Deleted

## 2022-07-04 DIAGNOSIS — I214 Non-ST elevation (NSTEMI) myocardial infarction: Secondary | ICD-10-CM

## 2022-07-04 DIAGNOSIS — Z955 Presence of coronary angioplasty implant and graft: Secondary | ICD-10-CM

## 2022-07-04 NOTE — Progress Notes (Signed)
Daily Session Note  Patient Details  Name: Jeremy Gilbert MRN: [id number] Date of Birth: Feb 04, 1951 Referring Provider:   Flowsheet Row Cardiac Rehab from 04/28/2022 in [facility] Cardiac and Pulmonary Rehab  Referring Provider End, Harrell Gave MD       Encounter Date: 07/04/2022  Check In:  Session Check In - 07/04/22 0839       Check-In   Supervising physician immediately available to respond to emergencies See telemetry face sheet for immediately available ER MD    Location [facility]-Cardiac & Pulmonary Rehab    Staff Present Heath Lark, RN, BSN, CCRP;Jessica [place], MA, RCEP, CCRP, Mindi Curling, RN, [place]    Virtual Visit No    Medication changes reported     No    Fall or balance concerns reported    No    Warm-up and Cool-down Performed on first and last piece of equipment    Resistance Training Performed Yes    VAD Patient? No    PAD/SET Patient? No      Pain Assessment   Currently in Pain? No/denies                Social History   Tobacco Use  Smoking Status Former   Types: Cigarettes   Start date: 01/07/1968   Quit date: 01/07/2000   Years since quitting: 22.5  Smokeless Tobacco Never    Goals Met:  Independence with exercise equipment Exercise tolerated well No report of concerns or symptoms today Strength training completed today  Goals Unmet:  Not Applicable  Comments: Pt able to follow exercise prescription today without complaint.  Will continue to monitor for progression.    Dr. Emily Filbert is Medical Director for [place].  Dr. Ottie Glazier is Medical Director for [facility] Pulmonary Rehabilitation.

## 2022-07-05 ENCOUNTER — Encounter: Payer: Medicare Other | Admitting: *Deleted

## 2022-07-05 DIAGNOSIS — I214 Non-ST elevation (NSTEMI) myocardial infarction: Secondary | ICD-10-CM | POA: Diagnosis not present

## 2022-07-05 DIAGNOSIS — Z955 Presence of coronary angioplasty implant and graft: Secondary | ICD-10-CM

## 2022-07-05 NOTE — Progress Notes (Signed)
Daily Session Note  Patient Details  Name: Jeremy Gilbert MRN: [id number] Date of Birth: 10/03/1950 Referring Provider:   Flowsheet Row Cardiac Rehab from 04/28/2022 in [facility] Cardiac and Pulmonary Rehab  Referring Provider End, Christopher MD       Encounter Date: 07/05/2022  Check In:  Session Check In - 07/05/22 0830       Check-In   Supervising physician immediately available to respond to emergencies See telemetry face sheet for immediately available ER MD    Location [facility]-Cardiac & Pulmonary Rehab    Staff Present Susanne Bice, RN, BSN, CCRP;Jessica [place], MA, RCEP, CCRP, CCET;Kara [place], MS, ASCM CEP, Exercise Physiologist    Virtual Visit No    Medication changes reported     No    Fall or balance concerns reported    No    Warm-up and Cool-down Performed on first and last piece of equipment    Resistance Training Performed Yes    VAD Patient? No    PAD/SET Patient? No      Pain Assessment   Currently in Pain? No/denies                Social History   Tobacco Use  Smoking Status Former   Types: Cigarettes   Start date: 01/07/1968   Quit date: 01/07/2000   Years since quitting: 22.5  Smokeless Tobacco Never    Goals Met:  Independence with exercise equipment Exercise tolerated well No report of concerns or symptoms today  Goals Unmet:  Not Applicable  Comments: Pt able to follow exercise prescription today without complaint.  Will continue to monitor for progression.    Dr. Mark Miller is Medical Director for [facility].  Dr. Fuad Aleskerov is Medical Director for [facility] Pulmonary Rehabilitation. 

## 2022-07-06 ENCOUNTER — Encounter: Payer: Medicare Other | Admitting: *Deleted

## 2022-07-06 ENCOUNTER — Encounter: Payer: Self-pay | Admitting: *Deleted

## 2022-07-06 DIAGNOSIS — I214 Non-ST elevation (NSTEMI) myocardial infarction: Secondary | ICD-10-CM | POA: Diagnosis not present

## 2022-07-06 NOTE — Progress Notes (Signed)
Discharge Summary: Jeremy Gilbert (DOB: Jun 06, 2051)  [name] graduated today from  rehab with 36 sessions completed.  Details of the patient's exercise prescription and what He needs to do in order to continue the prescription and progress were discussed with patient.  Patient was given a copy of prescription and goals.  Patient verbalized understanding.  [name] plans to continue to exercise by walking and gardening.   6 Minute Walk     Row Name 04/28/22 1250 06/28/22 0834       6 Minute Walk   Phase Initial Discharge    Distance 1300 feet 1435 feet    Distance % Change -- 10.4 %    Distance Feet Change -- 135 ft    Walk Time 6 minutes 6 minutes    # of Rest Breaks 0 0    MPH 2.46 2.71    METS 2.73 2.91    RPE 7 11    VO2 Peak 9.57 10.18    Symptoms No No    Resting HR 77 bpm 71 bpm    Resting BP 134/62 124/58    Resting Oxygen Saturation  97 % 97 %    Exercise Oxygen Saturation  during 6 min walk 98 % --    Max Ex. HR 90 bpm 86 bpm    Max Ex. BP 126/74 124/54    2 Minute Post BP 118/60 --

## 2022-07-06 NOTE — Progress Notes (Signed)
Daily Session Note  Patient Details  Name: Jeremy Gilbert MRN: [id number] Date of Birth: 25-Feb-1951 Referring Provider:   Flowsheet Row Cardiac Rehab from 04/28/2022 in [facility] Cardiac and Pulmonary Rehab  Referring Provider End, Harrell Gave MD       Encounter Date: 07/06/2022  Check In:  Session Check In - 07/06/22 0838       Check-In   Supervising physician immediately available to respond to emergencies See telemetry face sheet for immediately available ER MD    Location [facility]-Cardiac & Pulmonary Rehab    Staff Present Antionette Fairy, BS, Exercise Physiologist;Jessica [place], MA, RCEP, CCRP, Mindi Curling, RN, [place]    Virtual Visit No    Medication changes reported     No    Fall or balance concerns reported    No    Warm-up and Cool-down Performed on first and last piece of equipment    Resistance Training Performed Yes    VAD Patient? No    PAD/SET Patient? No      Pain Assessment   Currently in Pain? No/denies                Social History   Tobacco Use  Smoking Status Former   Types: Cigarettes   Start date: 01/07/1968   Quit date: 01/07/2000   Years since quitting: 22.5  Smokeless Tobacco Never    Goals Met:  Independence with exercise equipment Exercise tolerated well No report of concerns or symptoms today Strength training completed today  Goals Unmet:  Not Applicable  Comments:  Asier graduated today from  rehab with 36 sessions completed.  Details of the patient's exercise prescription and what He needs to do in order to continue the prescription and progress were discussed with patient.  Patient was given a copy of prescription and goals.  Patient verbalized understanding.  Hassell plans to continue to exercise by walking and gardening.    Dr. Emily Filbert is Medical Director for [place].  Dr. Ottie Glazier is Medical Director for [facility] Pulmonary Rehabilitation.

## 2022-07-06 NOTE — Progress Notes (Signed)
Cardiac Individual Treatment Plan  Patient Details  Name: Jeremy Gilbert MRN: [id number] Date of Birth: [birth year] Referring Provider:   Flowsheet Row Cardiac Rehab from 04/28/2022 in [facility] Cardiac and Pulmonary Rehab  Referring Provider End, Harrell Gave MD       Initial Encounter Date:  Flowsheet Row Cardiac Rehab from 04/28/2022 in [facility] Cardiac and Pulmonary Rehab  Date 04/28/22       Visit Diagnosis: NSTEMI (non-ST elevation myocardial infarction) [facility]  Patient's Home Medications on Admission:  Current Outpatient Medications:    acetaminophen (TYLENOL) 500 MG tablet, Take 500 mg by mouth every 6 (six) hours as needed for mild pain, moderate pain, fever or headache., Disp: , Rfl:    KRILL OIL PO, Take 350 mg by mouth 2 (two) times daily., Disp: , Rfl:    losartan-hydrochlorothiazide (HYZAAR) 50-12.5 MG tablet, Take 1 tablet by mouth daily., Disp: , Rfl:    metoprolol succinate (TOPROL-XL) 100 MG 24 hr tablet, Take 1 tablet by mouth 2 (two) times daily., Disp: , Rfl:    nitroGLYCERIN (NITROSTAT) 0.4 MG SL tablet, Place 1 tablet (0.4 mg total) under the tongue every 5 (five) minutes as needed for chest pain., Disp: 25 tablet, Rfl: PRN   REPATHA SURECLICK 707 MG/ML SOAJ, Inject 140 mg into the skin every 14 (fourteen) days., Disp: , Rfl:    ticagrelor (BRILINTA) 90 MG TABS tablet, Take 1 tablet (90 mg total) by mouth 2 (two) times daily., Disp: 180 tablet, Rfl: 0 No current facility-administered medications for this visit.  Facility-Administered Medications Ordered in Other Visits:    iohexol (OMNIPAQUE) 300 MG/ML solution, , , PRN, Callwood, Dwayne D, MD, 350 mL at 03/11/22 1610  Past Medical History: Past Medical History:  Diagnosis Date   Actinic keratosis    Basal cell carcinoma 2005   L alar crease   Coronary artery disease 03/2022   NSTEMI s/p PCI to RCA   Tobacco abuse 01/27/2015    Tobacco Use: Social History   Tobacco Use  Smoking Status Former   Types:  Cigarettes   Start date: 01/07/1968   Quit date: 01/07/2000   Years since quitting: 22.5  Smokeless Tobacco Never    Labs: Review Flowsheet       Latest Ref Rng & Units 03/10/2022 03/11/2022  Labs for ITP Cardiac and Pulmonary Rehab  Cholestrol 0 - 200 mg/dL - 109   LDL (calc) 0 - 99 mg/dL - 46   HDL-C >40 mg/dL - 28   Trlycerides <150 mg/dL - 173   Hemoglobin A1c 4.8 - 5.6 % 6.1  -     Exercise Target Goals: Exercise Program Goal: Individual exercise prescription set using results from initial 6 min walk test and THRR while considering  patient's activity barriers and safety.   Exercise Prescription Goal: Initial exercise prescription builds to 30-45 minutes a day of aerobic activity, 2-3 days per week.  Home exercise guidelines will be given to patient during program as part of exercise prescription that the participant will acknowledge.   Education: Aerobic Exercise: - Group verbal and visual presentation on the components of exercise prescription. Introduces F.I.T.T principle from ACSM for exercise prescriptions.  Reviews F.I.T.T. principles of aerobic exercise including progression. Written material given at graduation. Flowsheet Row Cardiac Rehab from 06/22/2022 in [facility] Cardiac and Pulmonary Rehab  Date 06/15/22  Educator [facility]  Instruction Review Code 1- Verbalizes Understanding       Education: Resistance Exercise: - Group verbal and visual presentation on the components of  exercise prescription. Introduces F.I.T.T principle from ACSM for exercise prescriptions  Reviews F.I.T.T. principles of resistance exercise including progression. Written material given at graduation.    Education: Exercise & Equipment Safety: - Individual verbal instruction and demonstration of equipment use and safety with use of the equipment. Flowsheet Row Cardiac Rehab from 06/22/2022 in [facility] Cardiac and Pulmonary Rehab  Date 04/28/22  Educator [facility]  Instruction Review Code 1- Verbalizes  Understanding       Education: Exercise Physiology & General Exercise Guidelines: - Group verbal and written instruction with models to review the exercise physiology of the cardiovascular system and associated critical values. Provides general exercise guidelines with specific guidelines to those with heart or lung disease.  Flowsheet Row Cardiac Rehab from 06/22/2022 in [facility] Cardiac and Pulmonary Rehab  Date 06/08/22  Educator [facility]  Instruction Review Code 1- Verbalizes Understanding       Education: Flexibility, Balance, Mind/Body Relaxation: - Group verbal and visual presentation with interactive activity on the components of exercise prescription. Introduces F.I.T.T principle from ACSM for exercise prescriptions. Reviews F.I.T.T. principles of flexibility and balance exercise training including progression. Also discusses the mind body connection.  Reviews various relaxation techniques to help reduce and manage stress (i.e. Deep breathing, progressive muscle relaxation, and visualization). Balance handout provided to take home. Written material given at graduation.   Activity Barriers & Risk Stratification:  Activity Barriers & Cardiac Risk Stratification - 04/28/22 1251       Activity Barriers & Cardiac Risk Stratification   Activity Barriers Back Problems;Balance Concerns;Deconditioning   chronic back pain   Cardiac Risk Stratification Moderate             6 Minute Walk:  6 Minute Walk     Row Name 04/28/22 1250 06/28/22 0834       6 Minute Walk   Phase Initial Discharge    Distance 1300 feet 1435 feet    Distance % Change -- 10.4 %    Distance Feet Change -- 135 ft    Walk Time 6 minutes 6 minutes    # of Rest Breaks 0 0    MPH 2.46 2.71    METS 2.73 2.91    RPE 7 11    VO2 Peak 9.57 10.18    Symptoms No No    Resting HR 77 bpm 71 bpm    Resting BP 134/62 124/58    Resting Oxygen Saturation  97 % 97 %    Exercise Oxygen Saturation  during 6 min walk 98 % --     Max Ex. HR 90 bpm 86 bpm    Max Ex. BP 126/74 124/54    2 Minute Post BP 118/60 --             Oxygen Initial Assessment:   Oxygen Re-Evaluation:   Oxygen Discharge (Final Oxygen Re-Evaluation):   Initial Exercise Prescription:  Initial Exercise Prescription - 04/28/22 1200       Date of Initial Exercise RX and Referring Provider   Date 04/28/22    Referring Provider End, Harrell Gave MD      Oxygen   Maintain Oxygen Saturation 88% or higher      Treadmill   MPH 2.3    Grade 1    Minutes 15    METs 3.12      NuStep   Level 3    SPM 80    Minutes 15    METs 2      Recumbant Elliptical  Level 1    RPM 50    Minutes 15    METs 2      Elliptical   Level 1    Speed 2.5    Minutes 15    METs 2      REL-XR   Level 2    Speed 50    Minutes 15    METs 2      Track   Laps 35    Minutes 15    METs 2.9      Prescription Details   Frequency (times per week) 3    Duration Progress to 30 minutes of continuous aerobic without signs/symptoms of physical distress      Intensity   THRR 40-80% of Max Heartrate 105-134    Ratings of Perceived Exertion 11-13    Perceived Dyspnea 0-4      Progression   Progression Continue to progress workloads to maintain intensity without signs/symptoms of physical distress.      Resistance Training   Training Prescription Yes    Weight 5 lb    Reps 10-15             Perform Capillary Blood Glucose checks as needed.  Exercise Prescription Changes:   Exercise Prescription Changes     Row Name 04/28/22 1200 05/17/22 1300 05/23/22 0800 05/30/22 1600 06/13/22 1400     Response to Exercise   Blood Pressure (Admit) 134/62 122/76 -- 124/62 118/60   Blood Pressure (Exercise) 126/74 162/80 -- -- --   Blood Pressure (Exit) 118/60 110/60 -- 128/64 122/62   Heart Rate (Admit) 77 bpm 66 bpm -- 77 bpm 71 bpm   Heart Rate (Exercise) 90 bpm 100 bpm -- 108 bpm 94 bpm   Heart Rate (Exit) 68 bpm 87 bpm -- 84 bpm 84  bpm   Oxygen Saturation (Admit) 97 % -- -- -- --   Oxygen Saturation (Exercise) 98 % -- -- -- --   Rating of Perceived Exertion (Exercise) 7 15 -- 12 13   Symptoms none none -- none none   Comments walk test results First two weeks of exercise -- -- --   Duration -- Continue with 30 min of aerobic exercise without signs/symptoms of physical distress. -- Continue with 30 min of aerobic exercise without signs/symptoms of physical distress. Continue with 30 min of aerobic exercise without signs/symptoms of physical distress.   Intensity -- THRR unchanged -- THRR unchanged THRR unchanged     Progression   Progression -- Continue to progress workloads to maintain intensity without signs/symptoms of physical distress. -- Continue to progress workloads to maintain intensity without signs/symptoms of physical distress. Continue to progress workloads to maintain intensity without signs/symptoms of physical distress.   Average METs -- 2.76 -- 3.48 3.26     Resistance Training   Training Prescription -- Yes -- Yes Yes   Weight -- 5 lb -- 5 lb 5 lb   Reps -- 10-15 -- 10-15 10-15     Interval Training   Interval Training -- No -- No No     Treadmill   MPH -- 3.4 -- 2.8 2.7   Grade -- 2 -- 2.5 4   Minutes -- 15 -- 15 15   METs -- 4.54 -- 4.11 4.56     NuStep   Level -- -- -- -- 4   Minutes -- -- -- -- 15   METs -- -- -- -- 3.6     Recumbant Elliptical   Level --  2 -- 1.7 2   Minutes -- 15 -- 15 15   METs -- 2 -- 2 2.4     Elliptical   Level -- 1 -- -- --   Speed -- 2.5 -- -- --   Minutes -- 15 -- -- --   METs -- 2.2 -- -- --     REL-XR   Level -- 2 -- 4 --   Minutes -- 15 -- 15 --   METs -- -- -- 4.11 --     Home Exercise Plan   Plans to continue exercise at -- -- Home (comment)  walking, gardening Home (comment)  walking, gardening Home (comment)  walking, gardening   Frequency -- -- Add 2 additional days to program exercise sessions. Add 2 additional days to program exercise  sessions. Add 2 additional days to program exercise sessions.   Initial Home Exercises Provided -- -- 05/23/22 05/23/22 05/23/22     Oxygen   Maintain Oxygen Saturation -- 88% or higher -- 88% or higher 88% or higher    Row Name 06/27/22 1400             Response to Exercise   Blood Pressure (Admit) 118/60       Blood Pressure (Exit) 112/72       Heart Rate (Admit) 70 bpm       Heart Rate (Exercise) 91 bpm       Heart Rate (Exit) 86 bpm       Rating of Perceived Exertion (Exercise) 12       Symptoms none       Duration Continue with 30 min of aerobic exercise without signs/symptoms of physical distress.       Intensity THRR unchanged         Progression   Progression Continue to progress workloads to maintain intensity without signs/symptoms of physical distress.       Average METs 3.86         Resistance Training   Training Prescription Yes       Weight 5 lb       Reps 10-15         Interval Training   Interval Training No         Treadmill   MPH 2.5       Grade 6       Minutes 15       METs 4.98         Recumbant Elliptical   Level 1.4       Minutes 15       METs 1.9         REL-XR   Level 6       Minutes 15       METs 4.9         Home Exercise Plan   Plans to continue exercise at Home (comment)  walking, gardening       Frequency Add 2 additional days to program exercise sessions.       Initial Home Exercises Provided 05/23/22         Oxygen   Maintain Oxygen Saturation 88% or higher                Exercise Comments:   Exercise Comments     Row Name 05/02/22 1035           Exercise Comments First full day of exercise!  Patient was oriented to gym and equipment including functions, settings, policies, and procedures.  Patient's individual exercise prescription and treatment plan were reviewed.  All starting workloads were established based on the results of the 6 minute walk test done at initial orientation visit.  The plan for exercise  progression was also introduced and progression will be customized based on patient's performance and goals.                Exercise Goals and Review:   Exercise Goals     Row Name 04/28/22 1257             Exercise Goals   Increase Physical Activity Yes       Intervention Provide advice, education, support and counseling about physical activity/exercise needs.;Develop an individualized exercise prescription for aerobic and resistive training based on initial evaluation findings, risk stratification, comorbidities and participant's personal goals.       Expected Outcomes Short Term: Attend rehab on a regular basis to increase amount of physical activity.;Long Term: Add in home exercise to make exercise part of routine and to increase amount of physical activity.;Long Term: Exercising regularly at least 3-5 days a week.       Increase Strength and Stamina Yes       Intervention Provide advice, education, support and counseling about physical activity/exercise needs.;Develop an individualized exercise prescription for aerobic and resistive training based on initial evaluation findings, risk stratification, comorbidities and participant's personal goals.       Expected Outcomes Short Term: Increase workloads from initial exercise prescription for resistance, speed, and METs.;Short Term: Perform resistance training exercises routinely during rehab and add in resistance training at home;Long Term: Improve cardiorespiratory fitness, muscular endurance and strength as measured by increased METs and functional capacity (6MWT)       Able to understand and use rate of perceived exertion (RPE) scale Yes       Intervention Provide education and explanation on how to use RPE scale       Expected Outcomes Short Term: Able to use RPE daily in rehab to express subjective intensity level;Long Term:  Able to use RPE to guide intensity level when exercising independently       Able to understand and use  Dyspnea scale Yes       Intervention Provide education and explanation on how to use Dyspnea scale       Expected Outcomes Short Term: Able to use Dyspnea scale daily in rehab to express subjective sense of shortness of breath during exertion;Long Term: Able to use Dyspnea scale to guide intensity level when exercising independently       Knowledge and understanding of Target Heart Rate Range (THRR) Yes       Intervention Provide education and explanation of THRR including how the numbers were predicted and where they are located for reference       Expected Outcomes Short Term: Able to state/look up THRR;Short Term: Able to use daily as guideline for intensity in rehab;Long Term: Able to use THRR to govern intensity when exercising independently       Able to check pulse independently Yes       Intervention Provide education and demonstration on how to check pulse in carotid and radial arteries.;Review the importance of being able to check your own pulse for safety during independent exercise       Expected Outcomes Short Term: Able to explain why pulse checking is important during independent exercise;Long Term: Able to check pulse independently and accurately       Understanding of Exercise Prescription  Yes       Intervention Provide education, explanation, and written materials on patient's individual exercise prescription       Expected Outcomes Short Term: Able to explain program exercise prescription;Long Term: Able to explain home exercise prescription to exercise independently                Exercise Goals Re-Evaluation :  Exercise Goals Re-Evaluation     Row Name 05/02/22 1036 05/17/22 1359 05/23/22 0818 05/30/22 1624 06/13/22 1457     Exercise Goal Re-Evaluation   Exercise Goals Review Increase Physical Activity;Increase Strength and Stamina;Able to understand and use rate of perceived exertion (RPE) scale;Able to understand and use Dyspnea scale;Knowledge and understanding of  Target Heart Rate Range (THRR);Understanding of Exercise Prescription Increase Physical Activity;Increase Strength and Stamina;Understanding of Exercise Prescription Increase Physical Activity;Increase Strength and Stamina;Understanding of Exercise Prescription;Able to understand and use rate of perceived exertion (RPE) scale;Knowledge and understanding of Target Heart Rate Range (THRR);Able to understand and use Dyspnea scale;Able to check pulse independently Increase Physical Activity;Increase Strength and Stamina;Understanding of Exercise Prescription Increase Physical Activity;Increase Strength and Stamina;Understanding of Exercise Prescription   Comments Reviewed RPE and dyspnea scales, THR and program prescription with pt today.  Pt voiced understanding and was given a copy of goals to take home. Vega is off to a good start in rehab. He increased his overall average MET level to 2.76 METs. He also increased his workload on the treadmill to a speed of 3.4 mph and an incline of 2%. He also has tolerated using 5 lb hand weights for resistance training. We will continue to monitor his progress in the program. Reviewed home exercise with pt today.  Pt plans to walk and do yard work for exercise.  Reviewed THR, pulse, RPE, sign and symptoms, pulse oximetery and when to call 911 or MD.  Also discussed weather considerations and indoor options.  Pt voiced understanding. Stormy Card continues to do well in rehab. He increased to level 4 on the XR and worked up over 4 METs on the treadmill. RPEs staying in appropriate range. We will continue to monitor. Stormy Card is doing well in rehab. He recently increased his workload on the treadmill to a speed of 2.7 mph with an incline of 4%. He also improved to level 4 on the T4 and level 2 on the REL. He has continued to tolerate 5 lb hand weights for resistance training as well. We will continue to monitor his progress in the program.   Expected Outcomes Short: Use RPE daily to  regulate intensity. Long: Follow program prescription in THR. Short: Continue to increase workloads. Long: Continue to increase strength and stamina. Short: Start to add in cardio at home not just yard work Long: Continue to improve stamina Short: increase level on REL Long: Continue to increase overall MET level Short: Stay consistent with increased workload on the treadmill. Long: Continue to increase strength and stamina.    [place] Name 06/14/22 0754 06/27/22 1508           Exercise Goal Re-Evaluation   Exercise Goals Review Increase Physical Activity;Increase Strength and Stamina;Understanding of Exercise Prescription Increase Physical Activity;Increase Strength and Stamina;Understanding of Exercise Prescription      Comments Stormy Card is doing well in rehab.  He went to the beach for a long weekend.  He went for a walk on the beach and did lots of stairs.  He is getting his strength and stamina back and feeling back to almost normal. Rusty continues  to do well in rehab. He increased to a 6% incline on the treadmill. He is due for his post 6MWT and we hope to see significant improvement. We also hope to see his REL level increase as well. Will continue to monitor.      Expected Outcomes Short; COnitnue to walk regularly at home Long; Conitnue to improve stamina Short: Improve on post 6MWT Long: Continue to increase overall MET level               Discharge Exercise Prescription (Final Exercise Prescription Changes):  Exercise Prescription Changes - 06/27/22 1400       Response to Exercise   Blood Pressure (Admit) 118/60    Blood Pressure (Exit) 112/72    Heart Rate (Admit) 70 bpm    Heart Rate (Exercise) 91 bpm    Heart Rate (Exit) 86 bpm    Rating of Perceived Exertion (Exercise) 12    Symptoms none    Duration Continue with 30 min of aerobic exercise without signs/symptoms of physical distress.    Intensity THRR unchanged      Progression   Progression Continue to progress workloads  to maintain intensity without signs/symptoms of physical distress.    Average METs 3.86      Resistance Training   Training Prescription Yes    Weight 5 lb    Reps 10-15      Interval Training   Interval Training No      Treadmill   MPH 2.5    Grade 6    Minutes 15    METs 4.98      Recumbant Elliptical   Level 1.4    Minutes 15    METs 1.9      REL-XR   Level 6    Minutes 15    METs 4.9      Home Exercise Plan   Plans to continue exercise at Home (comment)   walking, gardening   Frequency Add 2 additional days to program exercise sessions.    Initial Home Exercises Provided 05/23/22      Oxygen   Maintain Oxygen Saturation 88% or higher             Nutrition:  Target Goals: Understanding of nutrition guidelines, daily intake of sodium <1526m, cholesterol <2025m calories 30% from fat and 7% or less from saturated fats, daily to have 5 or more servings of fruits and vegetables.  Education: All About Nutrition: -Group instruction provided by verbal, written material, interactive activities, discussions, models, and posters to present general guidelines for heart healthy nutrition including fat, fiber, MyPlate, the role of sodium in heart healthy nutrition, utilization of the nutrition label, and utilization of this knowledge for meal planning. Follow up email sent as well. Written material given at graduation. [facility]  Education need identified 04/28/22  Date 06/22/22  Educ[place] Code 1- Verbalizes Understanding       Biometrics:  Pre Biometrics - 04/28/22 1257       Pre Biometrics   Height 5' 6.4" (1.687 m)    Weight 163 lb 6.4 oz (74.1 kg)    Waist Circumference 37.5 inches    Hip Circumference 36 inches    Waist to Hip Ratio 1.04 %    BMI (Calculated) 26.04    Single Leg Stand 4.7 seconds             Post Biometrics - 06/28/22 080867  Post   Biometrics   Height 5' 6.4" (1.687 m)    Weight 163 lb 12.8 oz (74.3 kg)    BMI (Calculated) 26.11    Single Leg Stand 18.2 seconds             Nutrition Therapy Plan and Nutrition Goals:  Nutrition Therapy & Goals - 05/09/22 0835       Nutrition Therapy   RD appointment deferred Yes   Pt would not like to meet with RD at this time. Will continue to follow up.     Personal Nutrition Goals   Nutrition Goal Pt would not like to meet with RD at this time. Will continue to follow up.             Nutrition Assessments:  MEDIFICTS Score Key: ?70 Need to make dietary changes  40-70 Heart Healthy Diet ? 40 Therapeutic Level Cholesterol Diet  Flowsheet Row Cardiac Rehab from 07/05/2022 in [facility] Cardiac and Pulmonary Rehab  Picture Your Plate Total Score on Admission 80  Picture Your Plate Total Score on Discharge 75      Picture Your Plate Scores: <20 Unhealthy dietary pattern with much room for improvement. 41-50 Dietary pattern unlikely to meet recommendations for good health and room for improvement. 51-60 More healthful dietary pattern, with some room for improvement.  >60 Healthy dietary pattern, although there may be some specific behaviors that could be improved.    Nutrition Goals Re-Evaluation:  Nutrition Goals Re-Evaluation     [place] Name 05/23/22 0802 06/14/22 0758           Goals   Current Weight 163 lb (73.9 kg) --      Nutrition Goal Pt would not like to meet with RD at this time. Will continue to follow up. Pt would not like to meet with RD at this time. Will continue to follow up.      Comment -- Stormy Card is doing well in rehab.  He is doing well with his diet.  He admits that he has a hard time with portion control but continues to work on it. He is getting in a good variety.      Expected Outcome -- Short: Continue to work on portion control  Long: Continue to focus on heart healthy eating               Nutrition Goals Discharge (Final Nutrition  Goals Re-Evaluation):  Nutrition Goals Re-Evaluation - 06/14/22 0758       Goals   Nutrition Goal Pt would not like to meet with RD at this time. Will continue to follow up.    Comment Stormy Card is doing well in rehab.  He is doing well with his diet.  He admits that he has a hard time with portion control but continues to work on it. He is getting in a good variety.    Expected Outcome Short: Continue to work on portion control  Long: Continue to focus on heart healthy eating             Psychosocial: Target Goals: Acknowledge presence or absence of significant depression and/or stress, maximize coping skills, provide positive support system. Participant is able to verbalize types and ability to use techniques and skills needed for reducing stress and depression.   Education: Stress, Anxiety, and Depression - Group verbal and visual presentation to define topics covered.  Reviews how body is impacted by stress, anxiety, and depression.  Also discusses healthy ways to reduce stress and  to treat/manage anxiety and depression.  Written material given at graduation.   Education: Sleep Hygiene -Provides group verbal and written instruction about how sleep can affect your health.  Define sleep hygiene, discuss sleep cycles and impact of sleep habits. Review good sleep hygiene tips.    Initial Review & Psychosocial Screening:  Initial Psych Review & Screening - 03/29/22 0846       Initial Review   Current issues with Current Sleep Concerns    Comments Pt reports that he is not sleeping as well since he is not as active; he stopped taking his statin as it was making it even more difficult to sleep- he let his doctor know. He reports working around the house and the yard - and he helps his son with his job. He is also watching his grandchildren, gardening, and fishing; he is enjoying his retirment and is excited to go fishing this September. He walks about 0.25 miles a day in the morning as he  wakes up around [place]? Yes   he relies on his wife for support as well of his son and daughter who live nearby.     Barriers   Psychosocial barriers to participate in program Psychosocial barriers identified (see note);The patient should benefit from training in stress management and relaxation.      Screening Interventions   Interventions Encouraged to exercise;Provide feedback about the scores to participant;To provide support and resources with identified psychosocial needs    Expected Outcomes Short Term goal: Utilizing psychosocial counselor, staff and physician to assist with identification of specific Stressors or current issues interfering with healing process. Setting desired goal for each stressor or current issue identified.;Long Term Goal: Stressors or current issues are controlled or eliminated.;Short Term goal: Identification and review with participant of any Quality of Life or Depression concerns found by scoring the questionnaire.;Long Term goal: The participant improves quality of Life and PHQ9 Scores as seen by post scores and/or verbalization of changes             Quality of Life Scores:   Quality of Life - 07/05/22 0831       Quality of Life Scores   Health/Function Pre 28.4 %    Health/Function Post 30 %    Health/Function % Change 5.63 %    Socioeconomic Pre 28.5 %    Socioeconomic Post 30 %    Socioeconomic % Change  5.26 %    Psych/Spiritual Pre 30 %    Psych/Spiritual Post 30 %    Psych/Spiritual % Change 0 %    Family Pre 24 %    Family Post 28.8 %    Family % Change 20 %    GLOBAL Pre 28.11 %    GLOBAL Post 29.82 %    GLOBAL % Change 6.08 %            Scores of 19 and below usually indicate a poorer quality of life in these areas.  A difference of  2-3 points is a clinically meaningful difference.  A difference of 2-3 points in the total score of the Quality of Life Index has been associated with  significant improvement in overall quality of life, self-image, physical symptoms, and general health in studies assessing change in quality of life.  PHQ-9: Review Flowsheet       07/05/2022 04/28/2022  Depression screen PHQ 2/9  Decreased Interest 0 0  Down, Depressed, Hopeless  0 0  PHQ - 2 Score 0 0  Altered sleeping 0 0  Tired, decreased energy 0 0  Change in appetite 0 0  Feeling bad or failure about yourself  0 0  Trouble concentrating 0 0  Moving slowly or fidgety/restless 0 0  Suicidal thoughts 0 0  PHQ-9 Score 0 0  Difficult doing work/chores Not difficult at all Not difficult at all   Interpretation of Total Score  Total Score Depression Severity:  1-4 = Minimal depression, 5-9 = Mild depression, 10-14 = Moderate depression, 15-19 = Moderately severe depression, 20-27 = Severe depression   Psychosocial Evaluation and Intervention:  Psychosocial Evaluation - 03/29/22 0859       Psychosocial Evaluation & Interventions   Interventions Stress management education;Relaxation education;Encouraged to exercise with the program and follow exercise prescription    Comments Pt reports that he is not sleeping as well since he is not as active; he stopped taking his statin as it was making it even more difficult to sleep- he let his doctor know. He reports working around the house and the yard - and he helps his son with his job. He is also watching his grandchildren, gardening, and fishing; he is enjoying his retirment and is excited to go fishing this September. He walks about 0.25 miles a day in the morning as he wakes up around 6am. He relies on his wife as well as his son and daughter who live close for support. He feels he is doing well since his heart event and he checks his BP daily; it is running around 110-120/60s.    Expected Outcomes ST: attend all scheduled exercise/education sessions, progress exercise prescription LT: continue to progress exercise prescription independently     Continue Psychosocial Services  Follow up required by staff             Psychosocial Re-Evaluation:  Psychosocial Re-Evaluation     [place] Name 05/23/22 0755 06/14/22 0755           Psychosocial Re-Evaluation   Current issues with Current Sleep Concerns Current Stress Concerns      Comments Rusty states that his mood is doing well. He is sleeping better now that the weather is better. His cardiologist says that his blood work and heart is looking good. Stormy Card is doing well in rehab.  He just got back from the beach for a long weekend.  He is dealing with his brother and the stressors that come with it.  His brother is on lithium treatments and not always taking it which gets him in trouble.  He had lost his savings 5 months into retirement.  They were able to track him down yesterday after he had been hospitalized and assisted living to get help.  He is sleeping well.      Expected Outcomes Short: Continue to attend [facility] regularly for regular exercise and social engagement. Long: Continue to improve symptoms and manage a positive mental state. Short; Continue to deal with brother Long:Continue to focus on positive.      Interventions Encouraged to attend Cardiac Rehabilitation for the exercise Stress management education;Encouraged to attend Cardiac Rehabilitation for the exercise      Continue Psychosocial Services  Follow up required by staff Follow up required by staff               Psychosocial Discharge (Final Psychosocial Re-Evaluation):  Psychosocial Re-Evaluation - 06/14/22 0755       Psychosocial Re-Evaluation   Current issues with  Current Stress Concerns    Comments Stormy Card is doing well in rehab.  He just got back from the beach for a long weekend.  He is dealing with his brother and the stressors that come with it.  His brother is on lithium treatments and not always taking it which gets him in trouble.  He had lost his savings 5 months into retirement.  They were  able to track him down yesterday after he had been hospitalized and assisted living to get help.  He is sleeping well.    Expected Outcomes Short; Continue to deal with brother Long:Continue to focus on positive.    Interventions Stress management education;Encouraged to attend Cardiac Rehabilitation for the exercise    Continue Psychosocial Services  Follow up required by staff             Vocational Rehabilitation: Provide vocational rehab assistance to qualifying candidates.   Vocational Rehab Evaluation & Intervention:  Vocational Rehab - 07/05/22 5093       Initial Vocational Rehab Evaluation & Intervention   Assessment shows need for Vocational Rehabilitation No      Discharge Vocational Rehab   Discharge Vocational Rehabilitation RETIRED             Education: Education Goals: Education classes will be provided on a variety of topics geared toward better understanding of heart health and risk factor modification. Participant will state understanding/return demonstration of topics presented as noted by education test scores.  Learning Barriers/Preferences:  Learning Barriers/Preferences - 03/29/22 0908       Learning Barriers/Preferences   Learning Barriers None    Learning Preferences None             General Cardiac Education Topics:  AED/CPR: - Group verbal and written instruction with the use of models to demonstrate the basic use of the AED with the basic ABC's of resuscitation.   Anatomy and Cardiac Procedures: - Group verbal and visual presentation and models provide information about basic cardiac anatomy and function. Reviews the testing methods done to diagnose heart disease and the outcomes of the test results. Describes the treatment choices: Medical Management, Angioplasty, or Coronary Bypass Surgery for treating various heart conditions including Myocardial Infarction, Angina, Valve Disease, and Cardiac Arrhythmias.  Written material given at  graduation.   Medication Safety: - Group verbal and visual instruction to review commonly prescribed medications for heart and lung disease. Reviews the medication, class of the drug, and side effects. Includes the steps to properly store meds and maintain the prescription regimen.  Written material given at graduation. Flowsheet Row Cardiac Rehab from 06/22/2022 in [facility] Cardiac and Pulmonary Rehab  Date 05/12/22  Educator SB  Instruction Review Code 1- Verbalizes Understanding       Intimacy: - Group verbal instruction through game format to discuss how heart and lung disease can affect sexual intimacy. Written material given at graduation.. Flowsheet Row Cardiac Rehab from 06/22/2022 in [facility] Cardiac and Pulmonary Rehab  Date 06/15/22  Educator [facility]  Instruction Review Code 1- Verbalizes Understanding       Know Your Numbers and Heart Failure: - Group verbal and visual instruction to discuss disease risk factors for cardiac and pulmonary disease and treatment options.  Reviews associated critical values for Overweight/Obesity, Hypertension, Cholesterol, and Diabetes.  Discusses basics of heart failure: signs/symptoms and treatments.  Introduces Heart Failure Zone chart for action plan for heart failure.  Written material given at graduation.   Infection Prevention: - Provides verbal and written  material to individual with discussion of infection control including proper hand washing and proper equipment cleaning during exercise session. Flowsheet Row Cardiac Rehab from 06/22/2022 in [facility] Cardiac and Pulmonary Rehab  Date 04/28/22  Educator [facility]  Instruction Review Code 1- Verbalizes Understanding       Falls Prevention: - Provides verbal and written material to individual with discussion of falls prevention and safety. Flowsheet Row Cardiac Rehab from 06/22/2022 in [facility] Cardiac and Pulmonary Rehab  Education need identified 03/29/22  Date 03/29/22  Educator [place]  Instruction  Review Code 1- Verbalizes Understanding       Other: -Provides group and verbal instruction on various topics (see comments)   Knowledge Questionnaire Score:  Knowledge Questionnaire Score - 07/05/22 5809       Knowledge Questionnaire Score   Pre Score 25/26    Post Score 25/26             Core Components/Risk Factors/Patient Goals at Admission:  Personal Goals and Risk Factors at Admission - 04/28/22 1259       Core Components/Risk Factors/Patient Goals on Admission    Weight Management Yes;Weight Loss    Intervention Weight Management: Develop a combined nutrition and exercise program designed to reach desired caloric intake, while maintaining appropriate intake of nutrient and fiber, sodium and fats, and appropriate energy expenditure required for the weight goal.;Weight Management: Provide education and appropriate resources to help participant work on and attain dietary goals.    Admit Weight 163 lb 6.4 oz (74.1 kg)    Goal Weight: Short Term 155 lb (70.3 kg)    Goal Weight: Long Term 155 lb (70.3 kg)    Expected Outcomes Short Term: Continue to assess and modify interventions until short term weight is achieved;Long Term: Adherence to nutrition and physical activity/exercise program aimed toward attainment of established weight goal;Understanding recommendations for meals to include 15-35% energy as protein, 25-35% energy from fat, 35-60% energy from carbohydrates, less than 261m of dietary cholesterol, 20-35 gm of total fiber daily;Understanding of distribution of calorie intake throughout the day with the consumption of 4-5 meals/snacks;Weight Loss: Understanding of general recommendations for a balanced deficit meal plan, which promotes 1-2 lb weight loss per week and includes a negative energy balance of [phone] kcal/d    Diabetes Yes   diet controlled   Intervention Provide education about signs/symptoms and action to take for hypo/hyperglycemia.;Provide education  about proper nutrition, including hydration, and aerobic/resistive exercise prescription along with prescribed medications to achieve blood glucose in normal ranges: Fasting glucose 65-99 mg/dL    Expected Outcomes Short Term: Participant verbalizes understanding of the signs/symptoms and immediate care of hyper/hypoglycemia, proper foot care and importance of medication, aerobic/resistive exercise and nutrition plan for blood glucose control.;Long Term: Attainment of HbA1C < 7%.    Hypertension Yes    Intervention Provide education on lifestyle modifcations including regular physical activity/exercise, weight management, moderate sodium restriction and increased consumption of fresh fruit, vegetables, and low fat dairy, alcohol moderation, and smoking cessation.;Monitor prescription use compliance.    Expected Outcomes Short Term: Continued assessment and intervention until BP is < 140/951mHG in hypertensive participants. < 130/8039mG in hypertensive participants with diabetes, heart failure or chronic kidney disease.;Long Term: Maintenance of blood pressure at goal levels.    Lipids Yes    Intervention Provide education and support for participant on nutrition & aerobic/resistive exercise along with prescribed medications to achieve LDL <46m74mDL >40mg46m Expected Outcomes Short Term: Participant states understanding of  desired cholesterol values and is compliant with medications prescribed. Participant is following exercise prescription and nutrition guidelines.;Long Term: Cholesterol controlled with medications as prescribed, with individualized exercise RX and with personalized nutrition plan. Value goals: LDL < 26m, HDL > 40 mg.             Education:Diabetes - Individual verbal and written instruction to review signs/symptoms of diabetes, desired ranges of glucose level fasting, after meals and with exercise. Acknowledge that pre and post exercise glucose checks will be done for 3  sessions at entry of program.   Core Components/Risk Factors/Patient Goals Review:   Goals and Risk Factor Review     Row Name 05/23/22 0759 06/14/22 0800           Core Components/Risk Factors/Patient Goals Review   Personal Goals Review Weight Management/Obesity;Lipids Weight Management/Obesity;Lipids;Diabetes;Hypertension      Review Rusty states that he cannot sleep with statins. His cardiologist wants him to be on statins but he is not taking any since it makes him gasp for air when he sleeps. He will continue to monitor is lipids and follow up with his cardiologist. RStormy Cardis doing well in rehab. His weight goes up and down but never by more than 2 lb so he is fairly steady with it.  He is doing well with his sugars as he had not had any swings.  He had blood work done yesterday with follow up on Monday.  His pressures are doing well and he continues to check at home.      Expected Outcomes Short: maintain lipid readings. Long: have an adequate levels of cholesterol. Short; Follow up on blood work with doctor Long: Conitnue to montior risk factors.               Core Components/Risk Factors/Patient Goals at Discharge (Final Review):   Goals and Risk Factor Review - 06/14/22 0800       Core Components/Risk Factors/Patient Goals Review   Personal Goals Review Weight Management/Obesity;Lipids;Diabetes;Hypertension    Review RStormy Cardis doing well in rehab. His weight goes up and down but never by more than 2 lb so he is fairly steady with it.  He is doing well with his sugars as he had not had any swings.  He had blood work done yesterday with follow up on Monday.  His pressures are doing well and he continues to check at home.    Expected Outcomes Short; Follow up on blood work with doctor Long: Conitnue to montior risk factors.             ITP Comments:  ITP Comments     Row Name 03/29/22 0906 04/28/22 1245 05/02/22 1035 05/11/22 0758 06/08/22 1027   ITP Comments Virtual  orientation call completed today. He has an appointment on Date: 04/04/22  for EP eval and gym Orientation.  [facility] . Completed 6MWT and gym orientation. Initial ITP created and sent for review to Dr. MEmily Filbert Medical Director. First full day of exercise!  Patient was oriented to gym and equipment including functions, settings, policies, and procedures.  Patient's individual exercise prescription and treatment plan were reviewed.  All starting workloads were established based on the results of the 6 minute walk test done at initial orientation visit.  The plan for exercise progression was also introduced and progression will be customized based on patient's performance and goals. 30 Day review completed. Medical Director ITP review done, changes made  as directed, and signed approval by Market researcher.    New to program 30 Day review completed. Medical Director ITP review done, changes made as directed, and signed approval by Medical Director.    Row Name 07/06/22 0840           ITP Comments Jassiah graduated today from  rehab with 36 sessions completed.  Details of the patient's exercise prescription and what He needs to do in order to continue the prescription and progress were discussed with patient.  Patient was given a copy of prescription and goals.  Patient verbalized understanding.  Lavel plans to continue to exercise by walking and gardening.                Comments: Discharge ITP

## 2022-08-24 NOTE — Progress Notes (Signed)
Follow-up Outpatient Visit Date: 08/25/2022  Primary Care Provider: Juluis Pitch, MD [address] [address]  Chief Complaint: Follow-up coronary artery diseease  HPI:  Mr. Doo is a 72 y.o. male with history of coronary artery disease with NSTEMI and complex PCI to the RCA in 03/2022, hypertension, hyperlipidemia, and borderline diabetes mellitus, who presents for follow-up of coronary artery disease.  I last saw him in October, at which time he was feeling well.  Prior groin hematoma had resolved.  We discussed adding a PCSK9 inhibitor in the setting of his elevated LP(a) and statin intolerance.  However, Mr. Porche wished to defer in favor of repeat labs and follow-up with Dr. Lovie Macadamia.  He was subsequently started on Repatha about 6 weeks ago and has been tolerating this well other than some weight gain.  He notes that he has been less active during this time, too.  He denies chest pain, shortness of breath, palpitations, lightheadedness, and edema.  He inquires about some fatigue that he attributes to metoprolol.  This was more pronounced when he first started the medicine a few years ago but still finds that he sometimes has less energy to get up and do things.  Despite this, he tries to walk 1.5 miles at least 3 days a week and do some floor exercises.  Home blood pressures are typically well-controlled, usually 120-130/60-70.  --------------------------------------------------------------------------------------------------  Past Medical History:  Diagnosis Date   Actinic keratosis    Basal cell carcinoma 2005   L alar crease   Coronary artery disease 03/2022   NSTEMI s/p PCI to RCA   Tobacco abuse 01/27/2015   Past Surgical History:  Procedure Laterality Date   CENTRAL LINE INSERTION  03/14/2022   Procedure: CENTRAL LINE INSERTION;  Surgeon: Early Osmond, MD;  Location: [facility];  Service: Cardiovascular;;   CORONARY ATHERECTOMY N/A 03/14/2022    Procedure: CORONARY ATHERECTOMY;  Surgeon: Early Osmond, MD;  Location: [facility];  Service: Cardiovascular;  Laterality: N/A;   CORONARY STENT INTERVENTION N/A 03/11/2022   Procedure: CORONARY STENT INTERVENTION;  Surgeon: Yolonda Kida, MD;  Location: [facility];  Service: Cardiovascular;  Laterality: N/A;   CORONARY STENT INTERVENTION N/A 03/14/2022   Procedure: CORONARY STENT INTERVENTION;  Surgeon: Early Osmond, MD;  Location: [facility];  Service: Cardiovascular;  Laterality: N/A;   INTRAVASCULAR IMAGING/OCT N/A 03/14/2022   Procedure: INTRAVASCULAR IMAGING/OCT;  Surgeon: Early Osmond, MD;  Location: [facility];  Service: Cardiovascular;  Laterality: N/A;   LEFT HEART CATH N/A 03/14/2022   Procedure: Left Heart Cath;  Surgeon: Early Osmond, MD;  Location: [facility];  Service: Cardiovascular;  Laterality: N/A;   LEFT HEART CATH AND CORONARY ANGIOGRAPHY N/A 03/11/2022   Procedure: LEFT HEART CATH AND CORONARY ANGIOGRAPHY;  Surgeon: Yolonda Kida, MD;  Location: [facility];  Service: Cardiovascular;  Laterality: N/A;  following DR. Callwood's first case   OTHER SURGICAL HISTORY Right 04/08/1994   Right external fixation of tibia and fibia    Current Meds  Medication Sig   acetaminophen (TYLENOL) 500 MG tablet Take 500 mg by mouth every 6 (six) hours as needed for mild pain, moderate pain, fever or headache.   KRILL OIL PO Take 350 mg by mouth 2 (two) times daily.   losartan-hydrochlorothiazide (HYZAAR) 50-12.5 MG tablet Take 1 tablet by mouth daily.   metoprolol succinate (TOPROL-XL) 100 MG 24 hr tablet  Take 1 tablet by mouth 2 (two) times daily.   nitroGLYCERIN (NITROSTAT) 0.4 MG SL tablet Place 1 tablet (0.4 mg total) under the tongue every 5 (five) minutes as needed for chest pain.   REPATHA SURECLICK 097 MG/ML SOAJ Inject 140 mg into the skin every 14 (fourteen) days.   ticagrelor (BRILINTA) 90 MG TABS tablet Take 1  tablet (90 mg total) by mouth 2 (two) times daily.    Allergies: Ace inhibitors, Hydrocodone-acetaminophen, Oxycodone-acetaminophen, and Statins  Social History   Tobacco Use   Smoking status: Former    Types: Cigarettes    Start date: 01/07/1968    Quit date: 01/07/2000    Years since quitting: 22.6   Smokeless tobacco: Never  Vaping Use   Vaping Use: Never used  Substance Use Topics   Alcohol use: Not Currently    Alcohol/week: 18.0 standard drinks of alcohol    Types: 18 Cans of beer per week   Drug use: Never    Family History  Problem Relation Age of Onset   Heart attack Mother     Review of Systems: A 12-system review of systems was performed and was negative except as noted in the HPI.  --------------------------------------------------------------------------------------------------  Physical Exam: BP (!) 148/78 (BP Location: Right Arm)   Pulse 84   Ht '5\' 7"'$  (1.702 m)   Wt 168 lb (76.2 kg)   SpO2 97%   BMI 26.31 kg/m   General:  NAD. Neck: No JVD or HJR. Lungs: Clear to auscultation bilaterally without wheezes or crackles. Heart: Regular rate and rhythm without murmurs, rubs, or gallops. Abdomen: Soft, nontender, nondistended. Extremities: No lower extremity edema.  Lab Results  Component Value Date   WBC 9.3 03/15/2022   HGB 12.1 (L) 03/15/2022   HCT 34.9 (L) 03/15/2022   MCV 92.8 03/15/2022   PLT 179 03/15/2022    Lab Results  Component Value Date   NA 137 03/15/2022   K 3.4 (L) 03/15/2022   CL 109 03/15/2022   CO2 17 (L) 03/15/2022   BUN 9 03/15/2022   CREATININE 1.04 03/15/2022   GLUCOSE 114 (H) 03/15/2022   ALT 23 03/10/2022    Lab Results  Component Value Date   CHOL 109 03/11/2022   HDL 28 (L) 03/11/2022   LDLCALC 46 03/11/2022   TRIG 173 (H) 03/11/2022   CHOLHDL 3.9 03/11/2022    --------------------------------------------------------------------------------------------------  ASSESSMENT AND PLAN: Coronary artery  disease: Mr. Devery continues to do well following his complicated PCI to the RCA last year.  We will plan to continue at least 12 months of dual antiplatelet therapy with aspirin and ticagrelor.  Continue Repatha for secondary prevention with follow-up lipid panel and ALT in about 6 more weeks.  Hypertension: Blood pressure mildly elevated today but typically better at home.  Will continue current regimen of losartan-HCTZ and metoprolol.  Mr. Nydam will continue to monitor his blood pressure regularly at home.  Hyperlipidemia: LDL noted to be quite low on last check in August.  Triglycerides were mildly elevated.  LP(a) was also quite high.  Mr. Juba has been intolerant of statins but is now on Repatha, which we will plan to continue.  Recheck lipid panel and ALT in about 3 months.  Follow-up: Return to clinic in 6 months.  Nelva Bush, MD 08/25/2022 8:23 AM

## 2022-08-25 ENCOUNTER — Encounter: Payer: Self-pay | Admitting: Internal Medicine

## 2022-08-25 ENCOUNTER — Ambulatory Visit: Payer: Medicare Other | Attending: Internal Medicine | Admitting: Internal Medicine

## 2022-08-25 VITALS — BP 148/78 | HR 84 | Ht 67.0 in | Wt 168.0 lb

## 2022-08-25 DIAGNOSIS — I10 Essential (primary) hypertension: Secondary | ICD-10-CM | POA: Diagnosis not present

## 2022-08-25 DIAGNOSIS — I2510 Atherosclerotic heart disease of native coronary artery without angina pectoris: Secondary | ICD-10-CM | POA: Insufficient documentation

## 2022-08-25 DIAGNOSIS — E7849 Other hyperlipidemia: Secondary | ICD-10-CM

## 2022-08-25 DIAGNOSIS — Z79899 Other long term (current) drug therapy: Secondary | ICD-10-CM | POA: Diagnosis not present

## 2022-08-25 NOTE — Patient Instructions (Addendum)
Medication Instructions:  Your Physician recommend you continue on your current medication as directed.    *If you need a refill on your cardiac medications before your next appointment, please call your pharmacy*   Lab Work: Your provider would like for you to return in 6 weeks to have the following labs drawn: (lipid, LFT).   Please go to the [facility] entrance and check in at the front desk.  You do not need an appointment.  They are open from 7am-6 pm.  You will need to be fasting.   If you have labs (blood work) drawn today and your tests are completely normal, you will receive your results only by: [place] (if you have MyChart) OR A paper copy in the mail If you have any lab test that is abnormal or we need to change your treatment, we will call you to review the results.   Testing/Procedures: None ordered today   Follow-Up: At [facility], you and your health needs are our priority.  As part of our continuing mission to provide you with exceptional heart care, we have created designated Provider Care Teams.  These Care Teams include your primary Cardiologist (physician) and Advanced Practice Providers (APPs -  Physician Assistants and Nurse Practitioners) who all work together to provide you with the care you need, when you need it.  We recommend signing up for the patient portal called "MyChart".  Sign up information is provided on this After Visit Summary.  MyChart is used to connect with patients for Virtual Visits (Telemedicine).  Patients are able to view lab/test results, encounter notes, upcoming appointments, etc.  Non-urgent messages can be sent to your provider as well.   To learn more about what you can do with MyChart, go to [email or web address].    Your next appointment:   6 month(s)  Provider:   You may see Nelva Bush, MD or one of the following Advanced Practice Providers on your designated Care Team:   Murray Hodgkins,  NP Christell Faith, PA-C Cadence Kathlen Mody, PA-C Gerrie Nordmann, NP

## 2022-09-08 ENCOUNTER — Other Ambulatory Visit: Payer: Self-pay | Admitting: Internal Medicine

## 2022-09-27 ENCOUNTER — Other Ambulatory Visit (HOSPITAL_BASED_OUTPATIENT_CLINIC_OR_DEPARTMENT_OTHER): Payer: Self-pay

## 2022-10-10 ENCOUNTER — Other Ambulatory Visit
Admission: RE | Admit: 2022-10-10 | Discharge: 2022-10-10 | Disposition: A | Discharge status: Home / Self Care | Payer: Medicare Other | Attending: Internal Medicine | Admitting: Internal Medicine

## 2022-10-10 DIAGNOSIS — Z79899 Other long term (current) drug therapy: Secondary | ICD-10-CM | POA: Insufficient documentation

## 2022-10-10 LAB — LIPID PANEL
Cholesterol: 69 mg/dL (ref 0–200)
HDL: 29 mg/dL — ABNORMAL LOW (ref >40)
Total CHOL/HDL Ratio: 2.4 RATIO
Triglycerides: 233 mg/dL — ABNORMAL HIGH (ref <150)
VLDL: 47 mg/dL — ABNORMAL HIGH (ref 0–40)

## 2022-10-10 LAB — ALT: ALT: 47 U/L — ABNORMAL HIGH (ref 0–44)

## 2022-11-15 ENCOUNTER — Encounter: Payer: Medicare Other | Admitting: Dermatology

## 2022-11-21 ENCOUNTER — Ambulatory Visit (INDEPENDENT_AMBULATORY_CARE_PROVIDER_SITE_OTHER): Payer: Medicare Other | Admitting: Dermatology

## 2022-11-21 VITALS — BP 137/69 | HR 54

## 2022-11-21 DIAGNOSIS — Z1283 Encounter for screening for malignant neoplasm of skin: Secondary | ICD-10-CM

## 2022-11-21 DIAGNOSIS — L578 Other skin changes due to chronic exposure to nonionizing radiation: Secondary | ICD-10-CM

## 2022-11-21 DIAGNOSIS — D1801 Hemangioma of skin and subcutaneous tissue: Secondary | ICD-10-CM

## 2022-11-21 DIAGNOSIS — D229 Melanocytic nevi, unspecified: Secondary | ICD-10-CM

## 2022-11-21 DIAGNOSIS — L570 Actinic keratosis: Secondary | ICD-10-CM

## 2022-11-21 DIAGNOSIS — L821 Other seborrheic keratosis: Secondary | ICD-10-CM

## 2022-11-21 DIAGNOSIS — I781 Nevus, non-neoplastic: Secondary | ICD-10-CM | POA: Diagnosis not present

## 2022-11-21 DIAGNOSIS — B009 Herpesviral infection, unspecified: Secondary | ICD-10-CM | POA: Diagnosis not present

## 2022-11-21 DIAGNOSIS — L814 Other melanin hyperpigmentation: Secondary | ICD-10-CM

## 2022-11-21 DIAGNOSIS — L820 Inflamed seborrheic keratosis: Secondary | ICD-10-CM

## 2022-11-21 DIAGNOSIS — Z85828 Personal history of other malignant neoplasm of skin: Secondary | ICD-10-CM

## 2022-11-21 NOTE — Progress Notes (Signed)
Follow-Up Visit   Subjective  Jeremy Gilbert is a 72 y.o. male who presents for the following: Skin Cancer Screening and Upper Body Skin Exam  The patient presents for Upper Body Skin Exam (UBSE) for skin cancer screening and mole check. The patient has spots, moles and lesions to be evaluated, some may be new or changing. He has a history of BCC of the left nasal alar and history of Aks.    The following portions of the chart were reviewed this encounter and updated as appropriate: medications, allergies, medical history  Review of Systems:  No other skin or systemic complaints except as noted in HPI or Assessment and Plan.  Objective  Well appearing patient in no apparent distress; mood and affect are within normal limits.  All skin waist up examined. Relevant physical exam findings are noted in the Assessment and Plan.  L lower flank at waistline x 1 Erythematous stuck-on, waxy plaque  Left Hand Dorsum x 2 (2) Keratotic papules.    Assessment & Plan   Inflamed seborrheic keratosis L lower flank at waistline x 1  Symptomatic, irritating, patient would like treated.  Destruction of lesion - L lower flank at waistline x 1  Destruction method: cryotherapy   Informed consent: discussed and consent obtained   Lesion destroyed using liquid nitrogen: Yes   Region frozen until ice ball extended beyond lesion: Yes   Outcome: patient tolerated procedure well with no complications   Post-procedure details: wound care instructions given   Additional details:  Prior to procedure, discussed risks of blister formation, small wound, skin dyspigmentation, or rare scar following cryotherapy. Recommend Vaseline ointment to treated areas while healing.   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 They are chronic with expected duration over 1 year.  A portion of actinic keratoses will progress to squamous cell carcinoma of the skin. It is not possible to reliably predict which spots will progress to skin cancer and so treatment is recommended to prevent development of skin cancer.  Recommend daily broad spectrum sunscreen SPF 30+ to sun-exposed areas, reapply every 2 hours as needed.  Recommend staying in the shade or wearing long sleeves, sun glasses (UVA+UVB protection) and wide brim hats (4-inch brim around the entire circumference of the hat). Call for new or changing lesions.  Destruction of lesion - Left Hand Dorsum x 2  Destruction method: cryotherapy   Informed consent: discussed and consent obtained   Lesion destroyed using liquid nitrogen: Yes   Region frozen until ice ball extended beyond lesion: Yes   Outcome: patient tolerated procedure well with no complications   Post-procedure details: wound care instructions given   Additional details:  Prior to procedure, discussed risks of blister formation, small wound, skin dyspigmentation, or rare scar following cryotherapy. Recommend Vaseline ointment to treated areas while healing.    Lentigines, Seborrheic Keratoses, Hemangiomas - Benign normal skin lesions - Benign-appearing - Call for any changes  Melanocytic Nevi - Tan-brown and/or pink-flesh-colored symmetric macules and papules - Benign appearing on exam today - Observation - Call clinic for new or changing moles - Recommend daily use of broad spectrum spf 30+ sunscreen to sun-exposed areas.   Telangiectasia - Dilated blood vessels of the face - Benign appearing on exam - Call for changes  Actinic Damage - Chronic condition, secondary to cumulative UV/sun exposure - diffuse scaly erythematous macules with underlying  dyspigmentation - Recommend daily broad spectrum sunscreen SPF 30+ to sun-exposed areas, reapply every 2 hours as needed.  - Staying in the shade or wearing long sleeves, sun glasses (UVA+UVB  protection) and wide brim hats (4-inch brim around the entire circumference of the hat) are also recommended for sun protection.  - Call for new or changing lesions.  HERPESVIRAL INFECTION (COLD SORES) Exam Crusted pink patch on the right lower cutaneous lip.  Chronic condition with duration or expected duration over one year. Currently flared.  Herpes Simplex Virus = Cold Sores = Fever Blisters is a chronic recurring blistering; scabbing sore-producing viral infection that is recurrent usually in the same area triggered by stress, sun/UV exposure and trauma.  It is infectious and can be spread from person to person by direct contact.  It is not curable, but is treatable with topical and oral medication.  Treatment Plan Take Valacyclovir 2 grams every 12 hours for 2 doses with a glass of water at the first sign of symptoms. Patient will call for refills.  HISTORY OF BASAL CELL CARCINOMA OF THE SKIN - No evidence of recurrence today of the left alar crease - Recommend regular full body skin exams - Recommend daily broad spectrum sunscreen SPF 30+ to sun-exposed areas, reapply every 2 hours as needed.  - Call if any new or changing lesions are noted between office visits  Skin cancer screening performed today.  Return in about 1 year (around 11/21/2023) for UBSE, Hx BCC.  [name], CMA, am acting as scribe for Willeen Niece, MD .   Documentation: I have reviewed the above documentation for accuracy and completeness, and I agree with the above.  Willeen Niece, MD

## 2022-11-21 NOTE — Patient Instructions (Addendum)
Cryotherapy Aftercare  Wash gently with soap and water everyday.   Apply Vaseline and Band-Aid daily until healed.     Due to recent changes in healthcare laws, you may see results of your pathology and/or laboratory studies on MyChart before the doctors have had a chance to review them. We understand that in some cases there may be results that are confusing or concerning to you. Please understand that not all results are received at the same time and often the doctors may need to interpret multiple results in order to provide you with the best plan of care or course of treatment. Therefore, we ask that you please give us 2 business days to thoroughly review all your results before contacting the office for clarification. Should we see a critical lab result, you will be contacted sooner.   If You Need Anything After Your Visit  If you have any questions or concerns for your doctor, please call our main line at [phone] and press option 4 to reach your doctor's medical assistant. If no one answers, please leave a voicemail as directed and we will return your call as soon as possible. Messages left after 4 pm will be answered the following business day.   You may also send us a message via MyChart. We typically respond to MyChart messages within 1-2 business days.  For prescription refills, please ask your pharmacy to contact our office. Our fax number is [phone].  If you have an urgent issue when the clinic is closed that cannot wait until the next business day, you can page your doctor at the number below.    Please note that while we do our best to be available for urgent issues outside of office hours, we are not available 24/7.   If you have an urgent issue and are unable to reach us, you may choose to seek medical care at your doctor's office, retail clinic, urgent care center, or emergency room.  If you have a medical emergency, please immediately call 911 or go to the  emergency department.  Pager Numbers  - Dr. Kowalski: [phone]  - Dr. Moye: [phone]  - Dr. Stewart: [phone]  In the event of inclement weather, please call our main line at [phone] for an update on the status of any delays or closures.  Dermatology Medication Tips: Please keep the boxes that topical medications come in in order to help keep track of the instructions about where and how to use these. Pharmacies typically print the medication instructions only on the boxes and not directly on the medication tubes.   If your medication is too expensive, please contact our office at [phone] option 4 or send us a message through MyChart.   We are unable to tell what your co-pay for medications will be in advance as this is different depending on your insurance coverage. However, we may be able to find a substitute medication at lower cost or fill out paperwork to get insurance to cover a needed medication.   If a prior authorization is required to get your medication covered by your insurance company, please allow us 1-2 business days to complete this process.  Drug prices often vary depending on where the prescription is filled and some pharmacies may offer cheaper prices.  The website www.goodrx.com contains coupons for medications through different pharmacies. The prices here do not account for what the cost may be with help from insurance (it may be cheaper with your insurance), but the website can   give you the price if you did not use any insurance.  - You can print the associated coupon and take it with your prescription to the pharmacy.  - You may also stop by our office during regular business hours and pick up a GoodRx coupon card.  - If you need your prescription sent electronically to a different pharmacy, notify our office through [facility] MyChart or by phone at [phone] option 4.     Si Usted Necesita Algo Despus de Su Visita  Tambin puede  enviarnos un mensaje a travs de MyChart. Por lo general respondemos a los mensajes de MyChart en el transcurso de 1 a 2 das hbiles.  Para renovar recetas, por favor pida a su farmacia que se ponga en contacto con nuestra oficina. Nuestro nmero de fax es el [phone].  Si tiene un asunto urgente cuando la clnica est cerrada y que no puede esperar hasta el siguiente da hbil, puede llamar/localizar a su doctor(a) al nmero que aparece a continuacin.   Por favor, tenga en cuenta que aunque hacemos todo lo posible para estar disponibles para asuntos urgentes fuera del horario de oficina, no estamos disponibles las 24 horas del da, los 7 das de la semana.   Si tiene un problema urgente y no puede comunicarse con nosotros, puede optar por buscar atencin mdica  en el consultorio de su doctor(a), en una clnica privada, en un centro de atencin urgente o en una sala de emergencias.  Si tiene una emergencia mdica, por favor llame inmediatamente al 911 o vaya a la sala de emergencias.  Nmeros de bper  - Dr. Kowalski: [phone]  - Dra. Moye: [phone]  - Dra. Stewart: [phone]  En caso de inclemencias del tiempo, por favor llame a nuestra lnea principal al [phone] para una actualizacin sobre el estado de cualquier retraso o cierre.  Consejos para la medicacin en dermatologa: Por favor, guarde las cajas en las que vienen los medicamentos de uso tpico para ayudarle a seguir las instrucciones sobre dnde y cmo usarlos. Las farmacias generalmente imprimen las instrucciones del medicamento slo en las cajas y no directamente en los tubos del medicamento.   Si su medicamento es muy caro, por favor, pngase en contacto con nuestra oficina llamando al [phone] y presione la opcin 4 o envenos un mensaje a travs de MyChart.   No podemos decirle cul ser su copago por los medicamentos por adelantado ya que esto es diferente dependiendo de la cobertura de su seguro.  Sin embargo, es posible que podamos encontrar un medicamento sustituto a menor costo o llenar un formulario para que el seguro cubra el medicamento que se considera necesario.   Si se requiere una autorizacin previa para que su compaa de seguros cubra su medicamento, por favor permtanos de 1 a 2 das hbiles para completar este proceso.  Los precios de los medicamentos varan con frecuencia dependiendo del lugar de dnde se surte la receta y alguna farmacias pueden ofrecer precios ms baratos.  El sitio web www.goodrx.com tiene cupones para medicamentos de diferentes farmacias. Los precios aqu no tienen en cuenta lo que podra costar con la ayuda del seguro (puede ser ms barato con su seguro), pero el sitio web puede darle el precio si no utiliz ningn seguro.  - Puede imprimir el cupn correspondiente y llevarlo con su receta a la farmacia.  - Tambin puede pasar por nuestra oficina durante el horario de atencin regular y recoger una tarjeta de cupones de GoodRx.  -   Si necesita que su receta se enve electrnicamente a una farmacia diferente, informe a nuestra oficina a travs de MyChart de [place] o por telfono llamando al [phone] y presione la opcin 4.  

## 2022-11-29 ENCOUNTER — Encounter: Payer: Medicare Other | Admitting: Dermatology

## 2022-12-03 ENCOUNTER — Other Ambulatory Visit: Payer: Self-pay | Admitting: Internal Medicine

## 2023-03-12 ENCOUNTER — Other Ambulatory Visit: Payer: Self-pay | Admitting: Internal Medicine

## 2023-06-11 ENCOUNTER — Other Ambulatory Visit: Payer: Self-pay | Admitting: Internal Medicine

## 2023-06-12 NOTE — Telephone Encounter (Signed)
Please contact pt for future appointment. Pt overdue for 6 month f/u.  

## 2023-06-12 NOTE — Telephone Encounter (Signed)
Left voicemail to schedule follow up appt

## 2023-07-08 ENCOUNTER — Other Ambulatory Visit: Payer: Self-pay | Admitting: Internal Medicine

## 2023-09-13 ENCOUNTER — Ambulatory Visit: Payer: Medicare Other | Attending: Internal Medicine | Admitting: Internal Medicine

## 2023-09-13 ENCOUNTER — Encounter: Payer: Self-pay | Admitting: Internal Medicine

## 2023-09-13 VITALS — BP 144/76 | HR 58 | Ht 68.0 in | Wt 170.4 lb

## 2023-09-13 DIAGNOSIS — Z789 Other specified health status: Secondary | ICD-10-CM

## 2023-09-13 DIAGNOSIS — I10 Essential (primary) hypertension: Secondary | ICD-10-CM | POA: Diagnosis present

## 2023-09-13 DIAGNOSIS — E1169 Type 2 diabetes mellitus with other specified complication: Secondary | ICD-10-CM

## 2023-09-13 DIAGNOSIS — E785 Hyperlipidemia, unspecified: Secondary | ICD-10-CM

## 2023-09-13 DIAGNOSIS — Z79899 Other long term (current) drug therapy: Secondary | ICD-10-CM

## 2023-09-13 DIAGNOSIS — I2510 Atherosclerotic heart disease of native coronary artery without angina pectoris: Secondary | ICD-10-CM

## 2023-09-13 MED ORDER — LOSARTAN POTASSIUM-HCTZ 50-12.5 MG PO TABS: 2.0000 | ORAL_TABLET | Freq: Every day | ORAL | 3 refills | Status: DC

## 2023-09-13 NOTE — Patient Instructions (Signed)
 Medication Instructions:  Your physician recommends the following medication changes.  STOP TAKING: Brilinta    INCREASE: Losartan - Hydrochlorothiazide  50-12.5 mg to 2 tablets by mouth daily    *If you need a refill on your cardiac medications before your next appointment, please call your pharmacy*   Lab Work: Your provider would like for you to return in 2 weeks to have the following labs drawn: (BMP).   Please go to [facility] [address] ([facility] [address], [place] [address] You do not need an appointment.  They are open from 8 am- 4:30 pm.  Lunch from 1:00 pm- 2:00 pm You will need to be fasting.   Testing/Procedures: No test ordered today    Follow-Up: At [facility], you and your health needs are our priority.  As part of our continuing mission to provide you with exceptional heart care, we have created designated Provider Care Teams.  These Care Teams include your primary Cardiologist (physician) and Advanced Practice Providers (APPs -  Physician Assistants and Nurse Practitioners) who all work together to provide you with the care you need, when you need it.  We recommend signing up for the patient portal called MyChart.  Sign up information is provided on this After Visit Summary.  MyChart is used to connect with patients for Virtual Visits (Telemedicine).  Patients are able to view lab/test results, encounter notes, upcoming appointments, etc.  Non-urgent messages can be sent to your provider as well.   To learn more about what you can do with MyChart, go to [email or web address].    Your next appointment:   1 year(s)  Provider:   You may see Lonni Hanson, MD or one of the following Advanced Practice Providers on your designated Care Team:   Lonni Meager, NP Bernardino Bring, PA-C Cadence Franchester, PA-C Tylene Lunch, NP Barnie Hila, NP

## 2023-09-13 NOTE — Progress Notes (Signed)
 Cardiology Office Note:  .   Date:  09/13/2023  ID:  Jeremy Gilbert, DOB 1951/07/27, MRN [id number] PCP: Jeremy Lenis, MD   [facility] Providers Cardiologist:  Jeremy Hanson, MD     History of Present Illness: .   Discussed the use of AI scribe software for clinical note transcription with the patient, who gave verbal consent to proceed.  Jeremy Gilbert is a 73 y.o. male with history of coronary artery disease with NSTEMI and complex PCI to the RCA in 03/2022, hypertension, hyperlipidemia, and borderline diabetes mellitus, who presents for follow-up of coronary artery disease.  I last saw him in 08/2022, at which time he was feeling fairly well other than mild fatigue that he attributed to metoprolol .  We agreed to defer medication changes and additional testing.  Today, Jeremy Gilbert reports that he has been feeling well, denying chest pain, shortness of breath, or dizziness. He occasionally experiences irregular heartbeats, described as 'beep, beep, beep, beep, beep, you know, just kind of lag, lag between beats,' which he associates with dietary intake, particularly fried foods, though he seldom consumes them. No associated symptoms such as dizziness or shortness of breath during these episodes. He has a history of bleeding easily when nicked or cut, which he attributes to his current medications, Brilinta  and aspirin . 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 His blood pressure at home typically ranges from 130/60 to 145/80 mmHg. He is currently on losartan , hydrochlorothiazide , and metoprolol  for blood pressure management and reports no side effects from these medications. He also takes Repatha, which has effectively lowered his cholesterol levels without the side effects he experienced with statins. In terms of lifestyle, he remains active, working as an personnel officer, which involves physical activity. He does not consume a lot of salty foods  and rarely eats out.     ROS: See HPI  Studies Reviewed: SABRA   EKG Interpretation Date/Time:  Wednesday September 13 2023 09:56:24 EST Ventricular Rate:  58 PR Interval:  158 QRS Duration:  98 QT Interval:  402 QTC Calculation: 394 R Axis:   -20  Text Interpretation: Sinus bradycardia Nonspecific ST abnormality Abnormal ECG When compared with ECG of 20-May-2022 Inferior ST/T changes are less pronounced Confirmed by Joby Hershkowitz ([id number]) on 09/13/2023 10:17:59 AM    Risk Assessment/Calculations:     HYPERTENSION CONTROL Vitals:   09/13/23 0950 09/13/23 0959 09/13/23 1024  BP: (!) 140/82 139/77 (!) 144/76    The patient's blood pressure is elevated above target today.  In order to address the patient's elevated BP: A current anti-hypertensive medication was adjusted today.          Physical Exam:   VS:  BP (!) 144/76   Pulse (!) 58   Ht 5' 8 (1.727 m)   Wt 170 lb 6.4 oz (77.3 kg)   SpO2 94%   BMI 25.91 kg/m    Wt Readings from Last 3 Encounters:  09/13/23 170 lb 6.4 oz (77.3 kg)  08/25/22 168 lb (76.2 kg)  06/28/22 163 lb 12.8 oz (74.3 kg)    General:  NAD. Neck: No JVD or HJR. Lungs: Clear to auscultation bilaterally without wheezes or crackles. Heart: Regular rate and rhythm without murmurs, rubs, or gallops. Abdomen: Soft, nontender, nondistended. Extremities: No lower extremity edema.  ASSESSMENT AND PLAN: .    Coronary artery disease: Jeremy Gilbert continues to do well without recurrent angina following his complex PCI to the RCA in 03/2022.  As he is more than 12 months out from his stent placement and notes quite a bit of nuisance bleeding with DAPT, we have agreed to discontinue ticagrelor  when he finishes his current supply and to maintain indefinite aspirin  monotherapy.  His lipids are well-controlled on Repatha, which we will plan to continue given his history of statin intolerance.  Hypertension: Blood pressure mildly elevated today, similar to home  readings.  Jeremy Gilbert notes that he was previously on losartan -HCTZ 100/25 mg daily prior to his RCA PCI but that the medication was cut in half thereafter.  We have agreed to increase losartan -HCTZ back to 100/25 mg daily with plans to repeat a basic metabolic panel in 2 weeks.  Continue metoprolol  succinate 100 mg daily.  Hyperlipidemia associated with type 2 diabetes mellitus and statin intolerance: Most recent lipid panel in 12/2022 showed excellent LDL of 8 and triglycerides of 132.  Given intolerance of statins in the past, we will continue with Repatha.  Recent hemoglobin A1c elevated at 6.6, consistent with type 2 diabetes mellitus.  Continue working on lifestyle modifications.  Continue follow-up with Dr. Glover.    Dispo: Return to clinic in 1 year  Signed, Jeremy Hanson, MD

## 2023-09-30 LAB — BASIC METABOLIC PANEL
BUN/Creatinine Ratio: 17 (ref 10–24)
BUN: 23 mg/dL (ref 8–27)
CO2: 24 mmol/L (ref 20–29)
Calcium: 9.6 mg/dL (ref 8.6–10.2)
Chloride: 99 mmol/L (ref 96–106)
Creatinine, Ser: 1.36 mg/dL — ABNORMAL HIGH (ref 0.76–1.27)
Glucose: 164 mg/dL — ABNORMAL HIGH (ref 70–99)
Potassium: 4.1 mmol/L (ref 3.5–5.2)
Sodium: 140 mmol/L (ref 134–144)
eGFR: 55 mL/min/{1.73_m2} — ABNORMAL LOW (ref >59)

## 2023-10-03 ENCOUNTER — Telehealth: Payer: Self-pay | Admitting: Pharmacy Technician

## 2023-10-03 ENCOUNTER — Other Ambulatory Visit (HOSPITAL_COMMUNITY): Payer: Self-pay

## 2023-10-03 NOTE — Telephone Encounter (Signed)
 It is fine to prescribe this as losartan-HCTZ 100/25 mg once daily.  Yvonne Kendall, MD [facility]

## 2023-10-03 NOTE — Telephone Encounter (Signed)
 We received a fax that losartan/hydrochlorothiazide 50mg /12.5mg  take 2 tablets a day needs a prior auth. Insurance is requesting this to be changed to losartan/hydrochlorothiazide 100mg /25mg  1 a day. I ran a test claim and it would be 9.00 for 30 days.

## 2023-10-04 ENCOUNTER — Other Ambulatory Visit (HOSPITAL_COMMUNITY): Payer: Self-pay

## 2023-10-04 ENCOUNTER — Other Ambulatory Visit: Payer: Self-pay

## 2023-10-04 ENCOUNTER — Telehealth: Payer: Self-pay | Admitting: Pharmacy Technician

## 2023-10-04 MED ORDER — LOSARTAN POTASSIUM-HCTZ 50-12.5 MG PO TABS
2.0000 | ORAL_TABLET | Freq: Every day | ORAL | 3 refills | Status: AC
  Filled 2023-10-04: qty 60, 30d supply, fill #0

## 2023-10-04 MED ORDER — LOSARTAN POTASSIUM-HCTZ 100-25 MG PO TABS
1.0000 | ORAL_TABLET | Freq: Every day | ORAL | 3 refills | Status: DC
  Filled 2023-10-04: qty 90, 90d supply, fill #0

## 2023-10-04 NOTE — Telephone Encounter (Signed)
 The nurse contacted the patient to inform him that his insurance will not cover losartan-hydrochlorothiazide 50-12.5 mg (2 tablets daily). The patient stated that he is unable to take just one tablet and mentioned, 'One tablet does not agree with my body.' The patient is requesting a prior authorization for 2 tablets  Will forward to prior auth team

## 2023-10-04 NOTE — Addendum Note (Signed)
 Addended by: Parke Poisson on: 10/04/2023 03:17 PM   Modules accepted: Orders

## 2023-10-04 NOTE — Addendum Note (Signed)
 Addended by: Parke Poisson on: 10/04/2023 08:01 AM   Modules accepted: Orders

## 2023-10-04 NOTE — Telephone Encounter (Signed)
 Attempted to contact pt. Left message to call back  Willette Cluster, CPhT  You2 hours ago (11:28 AM)    I called his insurance and they said there is no prior authorization that can be done. I called his pharmacy and they said they are still getting the reject that his insurance will not pay for the 2 a day and that he can pay cash. He did just get the 2 a day on 09/25/23.When I run the 2 a day in our pharmacy it just says too soon until 10/10/23.

## 2023-10-04 NOTE — Telephone Encounter (Signed)
 Spoke to pt and made him aware of pharmacist recommendations. Pt stated he would prefer to stay on Losartan-hydrochlorothiazide 50-12.5 mg 2 tablets daily. Pt stated he will pay cash price.  Dr. Okey Dupre made aware and ok with pt changing back to losartan-hydrochlorothiazide 50-12.5 mg 2 tablets daily.

## 2023-10-04 NOTE — Telephone Encounter (Signed)
 Losartan-hydrochlorothiazide 100-25 mg sent to pharmacy as requested

## 2023-11-21 ENCOUNTER — Ambulatory Visit: Payer: Medicare Other | Admitting: Dermatology

## 2023-11-21 DIAGNOSIS — L720 Epidermal cyst: Secondary | ICD-10-CM

## 2023-11-21 DIAGNOSIS — L814 Other melanin hyperpigmentation: Secondary | ICD-10-CM | POA: Diagnosis not present

## 2023-11-21 DIAGNOSIS — B001 Herpesviral vesicular dermatitis: Secondary | ICD-10-CM

## 2023-11-21 DIAGNOSIS — D1801 Hemangioma of skin and subcutaneous tissue: Secondary | ICD-10-CM

## 2023-11-21 DIAGNOSIS — L578 Other skin changes due to chronic exposure to nonionizing radiation: Secondary | ICD-10-CM

## 2023-11-21 DIAGNOSIS — Z85828 Personal history of other malignant neoplasm of skin: Secondary | ICD-10-CM

## 2023-11-21 DIAGNOSIS — Z1283 Encounter for screening for malignant neoplasm of skin: Secondary | ICD-10-CM | POA: Diagnosis not present

## 2023-11-21 DIAGNOSIS — L729 Follicular cyst of the skin and subcutaneous tissue, unspecified: Secondary | ICD-10-CM

## 2023-11-21 DIAGNOSIS — L821 Other seborrheic keratosis: Secondary | ICD-10-CM

## 2023-11-21 DIAGNOSIS — L719 Rosacea, unspecified: Secondary | ICD-10-CM

## 2023-11-21 DIAGNOSIS — L570 Actinic keratosis: Secondary | ICD-10-CM | POA: Diagnosis not present

## 2023-11-21 DIAGNOSIS — D229 Melanocytic nevi, unspecified: Secondary | ICD-10-CM

## 2023-11-21 DIAGNOSIS — W908XXA Exposure to other nonionizing radiation, initial encounter: Secondary | ICD-10-CM | POA: Diagnosis not present

## 2023-11-21 DIAGNOSIS — B009 Herpesviral infection, unspecified: Secondary | ICD-10-CM

## 2023-11-21 DIAGNOSIS — Z872 Personal history of diseases of the skin and subcutaneous tissue: Secondary | ICD-10-CM

## 2023-11-21 MED ORDER — VALACYCLOVIR HCL 1 G PO TABS: ORAL_TABLET | ORAL | 5 refills | Status: AC

## 2023-11-21 MED ORDER — METRONIDAZOLE 0.75 % EX GEL: CUTANEOUS | 11 refills | Status: AC

## 2023-11-21 NOTE — Progress Notes (Signed)
 Follow-Up Visit   Subjective  Jeremy Gilbert is a 73 y.o. male who presents for the following: Skin Cancer Screening and Upper Body Skin Exam  The patient presents for Upper Body Skin Exam (UBSE) for skin cancer screening and mole check. The patient has spots, moles and lesions to be evaluated, some may be new or changing. History of BCC of the left alar crease, history of AKs.   The following portions of the chart were reviewed this encounter and updated as appropriate: medications, allergies, medical history  Review of Systems:  No other skin or systemic complaints except as noted in HPI or Assessment and Plan.  Objective  Well appearing patient in no apparent distress; mood and affect are within normal limits.  All skin waist up examined. Relevant physical exam findings are noted in the Assessment and Plan.  R index finger at base, R 3rd finger, R hand dorsum (3) Keratotic papules.  Assessment & Plan  Skin cancer screening performed today.  Actinic Damage - Chronic condition, secondary to cumulative UV/sun exposure - diffuse scaly erythematous macules with underlying dyspigmentation - Recommend daily broad spectrum sunscreen SPF 30+ to sun-exposed areas, reapply every 2 hours as needed.  - Staying in the shade or wearing long sleeves, sun glasses (UVA+UVB protection) and wide brim hats (4-inch brim around the entire circumference of the hat) are also recommended for sun protection.  - Call for new or changing lesions.  Lentigines, Seborrheic Keratoses, Hemangiomas - Benign normal skin lesions - Benign-appearing - Call for any changes  Melanocytic Nevi - Tan-brown and/or pink-flesh-colored symmetric macules and papules - Benign appearing on exam today - Observation - Call clinic for new or changing moles - Recommend daily use of broad spectrum spf 30+ sunscreen to sun-exposed areas.   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 Currently well-controlled.  Herpes Simplex Virus = Cold Sores = Fever Blisters is a chronic recurring blistering; scabbing sore-producing viral infection that is recurrent usually in the same area triggered by stress, sun/UV exposure and trauma.  It is infectious and can be spread from person to person by direct contact.  It is not curable, but is treatable with topical and oral medication.  Treatment Plan Take Valacyclovir 2 grams every 12 hours for 2 doses with a glass of water at the first sign of symptoms dsp #30 5Rf.  ROSACEA Exam Mid face erythema with telangiectasias   Chronic and persistent condition with duration or expected duration over one year. Condition is symptomatic/ bothersome to patient. Not currently at goal.  Rosacea is a chronic progressive skin condition usually affecting the face of adults, causing redness and/or acne bumps. It is treatable but not curable. It sometimes affects the eyes (ocular rosacea) as well. It may respond to topical and/or systemic medication and can flare with stress, sun exposure, alcohol, exercise, topical steroids (including hydrocortisone/cortisone 10) and some foods.  Daily application of broad spectrum spf 30+ sunscreen to face is recommended to reduce flares.  Treatment Plan Start metronidazole 0.75% gel apply once to twice daily to mid face dsp 45g 1 yr Rf.    EPIDERMAL INCLUSION CYST Exam: Subcutaneous nodule at posterior neck.  Benign-appearing. Exam most consistent with an epidermal inclusion cyst. Discussed that a cyst is a benign growth that can grow over time and sometimes get irritated or inflamed. Recommend observation if it is not bothersome. Discussed option of surgical excision to remove it  if it is growing, symptomatic, or other changes noted. Please call for new or changing lesions so they can be evaluated. SKIN CANCER SCREENING   HYPERTROPHIC ACTINIC KERATOSIS (3) R index finger  at base, R 3rd finger, R hand dorsum (3) Actinic keratoses are precancerous spots that appear secondary to cumulative UV radiation exposure/sun exposure over time. They are chronic with expected duration over 1 year. A portion of actinic keratoses will progress to squamous cell carcinoma of the skin. It is not possible to reliably predict which spots will progress to skin cancer and so treatment is recommended to prevent development of skin cancer.  Recommend daily broad spectrum sunscreen SPF 30+ to sun-exposed areas, reapply every 2 hours as needed.  Recommend staying in the shade or wearing long sleeves, sun glasses (UVA+UVB protection) and wide brim hats (4-inch brim around the entire circumference of the hat). Call for new or changing lesions. Destruction of lesion - R index finger at base, R 3rd finger, R hand dorsum (3)  Destruction method: cryotherapy   Informed consent: discussed and consent obtained   Lesion destroyed using liquid nitrogen: Yes   Region frozen until ice ball extended beyond lesion: Yes   Outcome: patient tolerated procedure well with no complications   Post-procedure details: wound care instructions given   Additional details:  Prior to procedure, discussed risks of blister formation, small wound, skin dyspigmentation, or rare scar following cryotherapy. Recommend Vaseline ointment to treated areas while healing.  ACTINIC SKIN DAMAGE   LENTIGO   SEBORRHEIC KERATOSIS   HEMANGIOMA OF SKIN   NEVUS   HSV (HERPES SIMPLEX VIRUS) INFECTION   ROSACEA   CYST OF SKIN     Return in about 1 year (around 11/20/2024) for UBSE, Hx AKs, Hx BCC.  IBernardine Bridegroom, CMA, am acting as scribe for Artemio Larry, MD .   Documentation: I have reviewed the above documentation for accuracy and completeness, and I agree with the above.  Artemio Larry, MD

## 2023-11-21 NOTE — Patient Instructions (Addendum)
 Cryotherapy Aftercare  Wash gently with soap and water everyday.   Apply Vaseline and Band-Aid daily until healed.   Rosacea  What is rosacea? Rosacea (say: ro-zay-sha) is a common skin disease that usually begins as a trend of flushing or blushing easily.  As rosacea progresses, a persistent redness in the center of the face will develop and may gradually spread beyond the nose and cheeks to the forehead and chin.  In some cases, the ears, chest, and back could be affected.  Rosacea may appear as tiny blood vessels or small red bumps that occur in crops.  Frequently they can contain pus, and are called "pustules".  If the bumps do not contain pus, they are referred to as "papules".  Rarely, in prolonged, untreated cases of rosacea, the oil glands of the nose and cheeks may become permanently enlarged.  This is called rhinophyma, and is seen more frequently in men.  Signs and Risks In its beginning stages, rosacea tends to come and go, which makes it difficult to recognize.  It can start as intermittent flushing of the face.  Eventually, blood vessels may become permanently visible.  Pustules and papules can appear, but can be mistaken for adult acne.  People of all races, ages, genders and ethnic groups are at risk of developing rosacea.  However, it is more common in women (especially around menopause) and adults with fair skin between the ages of 18 and 31.  Treatment Dermatologists typically recommend a combination of treatments to effectively manage rosacea.  Treatment can improve symptoms and may stop the progression of the rosacea.  Treatment may involve both topical and oral medications.  The tetracycline antibiotics are often used for their anti-inflammatory effect; however, because of the possibility of developing antibiotic resistance, they should not be used long term at full dose.  For dilated blood vessels the options include electrodessication (uses electric current through a small  needle), laser treatment, and cosmetics to hide the redness.   With all forms of treatment, improvement is a slow process, and patients may not see any results for the first 3-4 weeks.  It is very important to avoid the sun and other triggers.  Patients must wear sunscreen daily.  Skin Care Instructions: Cleanse the skin with a mild soap such as CeraVe cleanser, Cetaphil cleanser, or Dove soap once or twice daily as needed. Moisturize with Eucerin Redness Relief Daily Perfecting Lotion (has a subtle green tint), CeraVe Moisturizing Cream, or Oil of Olay Daily Moisturizer with sunscreen every morning and/or night as recommended. Makeup should be "non-comedogenic" (won't clog pores) and be labeled "for sensitive skin". Good choices for cosmetics are: Neutrogena, Almay, and Physician's Formula.  Any product with a green tint tends to offset a red complexion. If your eyes are dry and irritated, use artificial tears 2-3 times per day and cleanse the eyelids daily with baby shampoo.  Have your eyes examined at least every 2 years.  Be sure to tell your eye doctor that you have rosacea. Alcoholic beverages tend to cause flushing of the skin, and may make rosacea worse. Always wear sunscreen, protect your skin from extreme hot and cold temperatures, and avoid spicy foods, hot drinks, and mechanical irritation such as rubbing, scrubbing, or massaging the face.  Avoid harsh skin cleansers, cleansing masks, astringents, and exfoliation. If a particular product burns or makes your face feel tight, then it is likely to flare your rosacea. If you are having difficulty finding a sunscreen that you can tolerate,  you may try switching to a chemical-free sunscreen.  These are ones whose active ingredient is zinc oxide or titanium dioxide only.  They should also be fragrance free, non-comedogenic, and labeled for sensitive skin. Rosacea triggers may vary from person to person.  There are a variety of foods that have been  reported to trigger rosacea.  Some patients find that keeping a diary of what they were doing when they flared helps them avoid triggers.   Due to recent changes in healthcare laws, you may see results of your pathology and/or laboratory studies on MyChart before the doctors have had a chance to review them. We understand that in some cases there may be results that are confusing or concerning to you. Please understand that not all results are received at the same time and often the doctors may need to interpret multiple results in order to provide you with the best plan of care or course of treatment. Therefore, we ask that you please give [place] 2 business days to thoroughly review all your results before contacting the office for clarification. Should we see a critical lab result, you will be contacted sooner.   If You Need Anything After Your Visit  If you have any questions or concerns for your doctor, please call our main line at [phone] and press option 4 to reach your doctor's medical assistant. If no one answers, please leave a voicemail as directed and we will return your call as soon as possible. Messages left after 4 pm will be answered the following business day.   You may also send [place] a message via MyChart. We typically respond to MyChart messages within 1-2 business days.  For prescription refills, please ask your pharmacy to contact our office. Our fax number is [phone].  If you have an urgent issue when the clinic is closed that cannot wait until the next business day, you can page your doctor at the number below.    Please note that while we do our best to be available for urgent issues outside of office hours, we are not available 24/7.   If you have an urgent issue and are unable to reach [place], you may choose to seek medical care at your doctor's office, retail clinic, urgent care center, or emergency room.  If you have a medical emergency, please immediately call 911 or  go to the emergency department.  Pager Numbers  - Dr. Gwen Pounds: [phone]  - Dr. Roseanne Reno: [phone]  - Dr. Katrinka Blazing: [phone]   In the event of inclement weather, please call our main line at [phone] for an update on the status of any delays or closures.  Dermatology Medication Tips: Please keep the boxes that topical medications come in in order to help keep track of the instructions about where and how to use these. Pharmacies typically print the medication instructions only on the boxes and not directly on the medication tubes.   If your medication is too expensive, please contact our office at [phone] option 4 or send [place] a message through MyChart.   We are unable to tell what your co-pay for medications will be in advance as this is different depending on your insurance coverage. However, we may be able to find a substitute medication at lower cost or fill out paperwork to get insurance to cover a needed medication.   If a prior authorization is required to get your medication covered by your insurance company, please allow [place] 1-2 business days to complete this  process.  Drug prices often vary depending on where the prescription is filled and some pharmacies may offer cheaper prices.  The website www.goodrx.com contains coupons for medications through different pharmacies. The prices here do not account for what the cost may be with help from insurance (it may be cheaper with your insurance), but the website can give you the price if you did not use any insurance.  - You can print the associated coupon and take it with your prescription to the pharmacy.  - You may also stop by our office during regular business hours and pick up a GoodRx coupon card.  - If you need your prescription sent electronically to a different pharmacy, notify our office through [facility] or by phone at [phone] option 4.     Si Usted Necesita Algo Despus de Su  Visita  Tambin puede enviarnos un mensaje a travs de Clinical cytogeneticist. Por lo general respondemos a los mensajes de MyChart en el transcurso de 1 a 2 das hbiles.  Para renovar recetas, por favor pida a su farmacia que se ponga en contacto con nuestra oficina. Annie Sable de fax es [place] [phone].  Si tiene un asunto urgente cuando la clnica est cerrada y que no puede esperar hasta el siguiente da hbil, puede llamar/localizar a su doctor(a) al nmero que aparece a continuacin.   Por favor, tenga en cuenta que aunque hacemos todo lo posible para estar disponibles para asuntos urgentes fuera del horario de [place], no estamos disponibles las 24 horas del da, los 7 [address]  [address] de la [place].   Si tiene un problema urgente y no puede comunicarse con nosotros, puede optar por buscar atencin mdica  en el consultorio de su doctor(a), en una clnica privada, en un centro de atencin urgente o en una sala de emergencias.  Si tiene Engineer, drilling, por favor llame inmediatamente al 911 o vaya a la sala de emergencias.  Nmeros de bper  - Dr. Gwen Pounds: [phone]  - Dra. Roseanne Reno: [phone]  - Dr. Katrinka Blazing: [phone]   En caso de inclemencias del tiempo, por favor llame a Lacy Duverney principal al [phone] para una actualizacin sobre el [place] de cualquier retraso o cierre.  Consejos para la medicacin en dermatologa: Por favor, guarde las cajas en las que vienen los medicamentos de uso tpico para ayudarle a seguir las instrucciones sobre dnde y cmo usarlos. Las farmacias generalmente imprimen las instrucciones del medicamento slo en las cajas y no directamente en los tubos del [place].   Si su medicamento es muy caro, por favor, pngase en contacto con Rolm Gala llamando al [phone] y presione la opcin 4 o envenos un mensaje a travs de Clinical cytogeneticist.   No podemos decirle cul ser su copago por los medicamentos por adelantado ya que esto es diferente dependiendo de  la cobertura de su seguro. Sin embargo, es posible que podamos encontrar un medicamento sustituto a Audiological scientist un formulario para que el seguro cubra el medicamento que se considera necesario.   Si se requiere una autorizacin previa para que su compaa de seguros [place] su medicamento, por favor permtanos de 1 a 2 das hbiles para completar [address]  Los precios de los medicamentos varan con frecuencia dependiendo del Environmental consultant de dnde se surte la receta y alguna farmacias pueden ofrecer precios ms baratos.  El sitio web www.goodrx.com tiene cupones para medicamentos de Health and safety inspector. Los precios aqu no tienen en cuenta lo que podra costar con la ayuda del seguro (  puede ser ms barato con su seguro), pero el sitio web puede darle el precio si no Visual merchandiser.  - Puede imprimir el cupn correspondiente y llevarlo con su receta a la farmacia.  - Tambin puede pasar por nuestra oficina durante el horario de atencin regular y Education officer, museum una tarjeta de cupones de GoodRx.  - Si necesita que su receta se enve electrnicamente a una farmacia diferente, informe a nuestra oficina a travs de MyChart de [place] o por telfono llamando al [phone] y presione la opcin 4.

## 2024-10-18 ENCOUNTER — Ambulatory Visit: Admitting: Internal Medicine

## 2024-12-02 ENCOUNTER — Ambulatory Visit: Admitting: Dermatology
